# Patient Record
Sex: Male | Born: 1940 | Race: White | Hispanic: No | Marital: Married | Smoking: Former smoker
Health system: Southern US, Community
[De-identification: ages and names within clinical notes are randomized; demographics above are authoritative.]

## PROBLEM LIST (undated history)

## (undated) DIAGNOSIS — C4490 Unspecified malignant neoplasm of skin, unspecified: Secondary | ICD-10-CM

## (undated) DIAGNOSIS — E785 Hyperlipidemia, unspecified: Secondary | ICD-10-CM

## (undated) DIAGNOSIS — Z9889 Other specified postprocedural states: Secondary | ICD-10-CM

## (undated) DIAGNOSIS — R112 Nausea with vomiting, unspecified: Secondary | ICD-10-CM

## (undated) DIAGNOSIS — D369 Benign neoplasm, unspecified site: Secondary | ICD-10-CM

## (undated) DIAGNOSIS — I10 Essential (primary) hypertension: Secondary | ICD-10-CM

## (undated) DIAGNOSIS — M810 Age-related osteoporosis without current pathological fracture: Secondary | ICD-10-CM

## (undated) DIAGNOSIS — Z87442 Personal history of urinary calculi: Secondary | ICD-10-CM

## (undated) DIAGNOSIS — M5030 Other cervical disc degeneration, unspecified cervical region: Secondary | ICD-10-CM

## (undated) DIAGNOSIS — M869 Osteomyelitis, unspecified: Secondary | ICD-10-CM

## (undated) HISTORY — PX: TONSILLECTOMY: SUR1361

## (undated) HISTORY — PX: JOINT REPLACEMENT: SHX530

## (undated) HISTORY — PX: CHOLECYSTECTOMY: SHX55

## (undated) HISTORY — PX: BACK SURGERY: SHX140

## (undated) HISTORY — PX: CATARACT EXTRACTION W/ INTRAOCULAR LENS  IMPLANT, BILATERAL: SHX1307

## (undated) HISTORY — PX: COLONOSCOPY: SHX174

## (undated) HISTORY — PX: NECK SURGERY: SHX720

## (undated) HISTORY — PX: WRIST SURGERY: SHX841

## (undated) HISTORY — PX: SHOULDER SURGERY: SHX246

## (undated) HISTORY — PX: REPLACEMENT TOTAL KNEE: SUR1224

---

## 1998-11-18 ENCOUNTER — Encounter: Payer: Self-pay | Admitting: Orthopedic Surgery

## 1998-11-24 ENCOUNTER — Encounter: Payer: Self-pay | Admitting: Orthopedic Surgery

## 1998-11-24 ENCOUNTER — Inpatient Hospital Stay (HOSPITAL_COMMUNITY): Admission: RE | Admit: 1998-11-24 | Discharge: 1998-11-28 | Payer: Self-pay | Admitting: Orthopedic Surgery

## 2004-10-27 ENCOUNTER — Ambulatory Visit: Payer: Self-pay | Admitting: Ophthalmology

## 2005-08-07 ENCOUNTER — Emergency Department: Payer: Self-pay | Admitting: Emergency Medicine

## 2005-09-02 ENCOUNTER — Emergency Department: Payer: Self-pay | Admitting: Emergency Medicine

## 2005-09-03 ENCOUNTER — Ambulatory Visit: Payer: Self-pay | Admitting: Urology

## 2005-09-03 ENCOUNTER — Other Ambulatory Visit: Payer: Self-pay

## 2008-06-12 ENCOUNTER — Ambulatory Visit: Payer: Self-pay | Admitting: Internal Medicine

## 2008-10-26 ENCOUNTER — Ambulatory Visit: Payer: Self-pay | Admitting: Internal Medicine

## 2009-01-21 ENCOUNTER — Ambulatory Visit: Payer: Self-pay | Admitting: Unknown Physician Specialty

## 2009-04-18 ENCOUNTER — Ambulatory Visit: Payer: Self-pay | Admitting: Internal Medicine

## 2009-06-19 ENCOUNTER — Encounter: Admission: RE | Admit: 2009-06-19 | Discharge: 2009-06-19 | Payer: Self-pay | Admitting: Neurological Surgery

## 2009-06-30 ENCOUNTER — Inpatient Hospital Stay (HOSPITAL_COMMUNITY): Admission: RE | Admit: 2009-06-30 | Discharge: 2009-07-02 | Payer: Self-pay | Admitting: Neurological Surgery

## 2011-02-27 ENCOUNTER — Emergency Department: Payer: Self-pay | Admitting: Emergency Medicine

## 2011-03-05 ENCOUNTER — Emergency Department: Payer: Self-pay | Admitting: Emergency Medicine

## 2011-03-06 LAB — BASIC METABOLIC PANEL
Chloride: 105 mEq/L (ref 96–112)
GFR calc Af Amer: >60 mL/min (ref >60)
GFR calc non Af Amer: >60 mL/min (ref >60)
Potassium: 3.9 mEq/L (ref 3.5–5.1)

## 2011-03-06 LAB — CBC
HCT: 36.1 % — ABNORMAL LOW (ref 39.0–52.0)
MCV: 89.4 fL (ref 78.0–100.0)
RBC: 4.04 MIL/uL — ABNORMAL LOW (ref 4.22–5.81)
WBC: 8 10*3/uL (ref 4.0–10.5)

## 2011-03-06 LAB — GLUCOSE, CAPILLARY
Glucose-Capillary: 106 mg/dL — ABNORMAL HIGH (ref 70–99)
Glucose-Capillary: 114 mg/dL — ABNORMAL HIGH (ref 70–99)
Glucose-Capillary: 163 mg/dL — ABNORMAL HIGH (ref 70–99)
Glucose-Capillary: 167 mg/dL — ABNORMAL HIGH (ref 70–99)
Glucose-Capillary: 168 mg/dL — ABNORMAL HIGH (ref 70–99)

## 2011-03-06 LAB — ABO/RH: ABO/RH(D): A POS

## 2011-03-22 ENCOUNTER — Ambulatory Visit: Payer: Self-pay | Admitting: Surgery

## 2011-04-13 NOTE — Op Note (Signed)
Jay Brown, KRESSE            ACCOUNT NO.:  [id number]   MEDICAL RECORD NO.:  [id number]          PATIENT TYPE:  INP   LOCATION:  3172                         FACILITY:  [facility]   PHYSICIAN:  Stefani Dama, M.D.  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   PROCEDURE:  Bilateral laminotomies L4-L5 with decompression of L4 and L5  nerve roots with a total diskectomy L4-L5, posterior lumbar interbody  arthrodesis with placement of PEEK spacers, allograft and infuse  nonsegmental fixation L4-L5 with pedicle screws, posterolateral  arthrodesis L4-L5 with allograft.   SURGEON:  Dr. Barnett Abu.   FIRST ASSISTANT:  Hardin Negus, PA.   INDICATIONS:  Bernabe Dorce is a 70 year old individual who has had  significant back and bilateral lower extremity pain and weakness.  He  has evidence of a herniated nucleus pulposus with a fragment of disk on  the left side at L4-L5.  He has advanced spondylitic changes with severe  stenosis at L4-L5 also.  He has been advised regarding surgical  decompression and arthrodesis.   PROCEDURE:  The patient was brought to the operating room supine on a  stretcher.  After smooth induction of general endotracheal anesthesia,  he was turned prone.  The back was shaved and prepped with alcohol and  DuraPrep and draped in a sterile fashion.  Midline incision was created  and carried down to the lumbodorsal fascia, which was opened on either  side of midline to expose the lumbar vertebrae of L4 and L5.  L4 was  marked and identified positively with a radiograph.  Interlaminar space  at L4-L5 was then  cleared, and the inferior marginal lamina out to the  medial wall of the facet was then taken up with a high-speed bur.  A 2  and 3-mm Kerrison punch was then used to remove overgrown bone and  ligament from within the canal, and the common dural tube was then  decompressed.  The dissection was carried out to the lateral aspect of  the dura and superiorly a laminotomy was created to identify the L4  nerve root.  Superomedially on the left side, there was identified a  large calcified fragment of disk that was causing some compression on  the L4 nerve root and the common dural tube with vigorous dissection  using some recurved osteotomes and curettes.  The fragment could be  mobilized, and it could be removed in several pieces.  This provided  good decompression of the L4 nerve root and the common dural tube.  Diskectomy was then performed in the disk space, and here there was  noted be a substantial quantity of severely degenerating, desiccated  disk material.  The disk space was evacuated, and care was taken to  protect the L5 nerve root inferiorly and also to provide decompression  of this nerve root.  This was accomplished first on the left side and  then on the right side.  The interlaminar space and the interlaminar  ligament was then taken up.  The interspace was decorticated  appropriately, and it was then sized for appropriate size spacer.  It  was felt that 10-mm spacers would fit well into the interspace.  The  spacers were filled with a mixture of Infuse and Vitoss bone material,  and this was placed into the interspace.  The interspace itself was  filled with the same combination of materials, and the other PEEK spacer  was placed on the opposite side.  Pedicle screw entry sites were then  chosen at L4-L5 with radiographic control.  A 5.5 tap was used in L4 and  6.5 x 45 mm screws were placed into the L4 vertebra with each hole being  sounded after tapping before placing the  screws.   Radiographic confirmation of the screws was obtained, and when this  positioning was good, 35-mm pre-contoured rods were used to connect the  pedicle screws at L4-L5.  The lateral gutters had been previously  prepared and/or decorticated, and these were then packed with same  combination of Infuse and Vitoss bone sponge.  Hemostasis in the soft  tissues was then obtained meticulously.  The lumbodorsal fascia was  closed with #1 Vicryl in interrupted fashion, 2-0 Vicryl used in the  subcutaneous tissues, 3-0 Vicryl subcuticularly, and Dermabond on the  skin.  The patient tolerated the procedure well, was returned to the  recovery room in stable condition.      Stefani Dama, M.D.  Electronically Signed     HJE/MEDQ  D:  06/30/2009  T:  06/30/2009  Job:  [id number]

## 2011-12-01 ENCOUNTER — Other Ambulatory Visit: Payer: Self-pay | Admitting: Unknown Physician Specialty

## 2011-12-01 LAB — CLOSTRIDIUM DIFFICILE BY PCR

## 2011-12-03 LAB — STOOL CULTURE

## 2011-12-13 ENCOUNTER — Other Ambulatory Visit: Payer: Self-pay | Admitting: Unknown Physician Specialty

## 2011-12-23 ENCOUNTER — Ambulatory Visit: Payer: Self-pay | Admitting: Unknown Physician Specialty

## 2012-01-28 ENCOUNTER — Ambulatory Visit: Payer: Self-pay | Admitting: Internal Medicine

## 2012-02-21 ENCOUNTER — Ambulatory Visit: Payer: Self-pay | Admitting: Internal Medicine

## 2012-02-24 LAB — CBC CANCER CENTER
Basophil %: 0.8 %
Basophil: 2 %
Eosinophil #: 0.2 x10 3/mm (ref 0.0–0.7)
Eosinophil %: 2.5 %
HCT: 37.2 % — ABNORMAL LOW (ref 40.0–52.0)
Lymphocyte %: 27.8 %
Lymphocytes: 35 %
MCH: 28.6 pg (ref 26.0–34.0)
MCV: 87.6 fL (ref 80–100)
Monocyte #: 0.8 x10 3/mm — ABNORMAL HIGH (ref 0.0–0.7)
Monocytes: 10 %
RBC: 4.24 10*6/uL — ABNORMAL LOW (ref 4.40–5.90)
RDW: 12.9 % (ref 11.5–14.5)
WBC: 8 x10 3/mm (ref 3.8–10.6)

## 2012-02-24 LAB — FERRITIN: Ferritin (ARMC): 30 ng/mL (ref 8–388)

## 2012-02-24 LAB — RETICULOCYTES: Absolute Retic Count: 0.146 10*6/uL — ABNORMAL HIGH (ref 0.024–0.084)

## 2012-02-24 LAB — IRON AND TIBC: Iron: 53 ug/dL — ABNORMAL LOW (ref 65–175)

## 2012-02-24 LAB — FOLATE: Folic Acid: 38.5 ng/mL (ref 3.1–100.0)

## 2012-02-28 ENCOUNTER — Ambulatory Visit: Payer: Self-pay | Admitting: Internal Medicine

## 2012-02-28 LAB — PROT IMMUNOELECTROPHORES(ARMC)

## 2012-03-29 ENCOUNTER — Ambulatory Visit: Payer: Self-pay | Admitting: Internal Medicine

## 2014-01-08 ENCOUNTER — Other Ambulatory Visit: Payer: Self-pay | Admitting: Podiatry

## 2014-01-13 LAB — WOUND CULTURE

## 2014-02-19 ENCOUNTER — Ambulatory Visit: Payer: Self-pay | Admitting: Podiatry

## 2014-02-19 LAB — CBC WITH DIFFERENTIAL/PLATELET
BASOS ABS: 0.1 10*3/uL (ref 0.0–0.1)
BASOS PCT: 1.1 %
EOS ABS: 0.1 10*3/uL (ref 0.0–0.7)
EOS PCT: 0.9 %
HCT: 40.7 % (ref 40.0–52.0)
HGB: 13.2 g/dL (ref 13.0–18.0)
LYMPHS ABS: 2.8 10*3/uL (ref 1.0–3.6)
LYMPHS PCT: 23.1 %
MCH: 28 pg (ref 26.0–34.0)
MCHC: 32.4 g/dL (ref 32.0–36.0)
MCV: 87 fL (ref 80–100)
MONO ABS: 1.1 x10 3/mm — AB (ref 0.2–1.0)
Monocyte %: 9 %
NEUTROS PCT: 65.9 %
Neutrophil #: 7.9 10*3/uL — ABNORMAL HIGH (ref 1.4–6.5)
PLATELETS: 291 10*3/uL (ref 150–440)
RBC: 4.7 10*6/uL (ref 4.40–5.90)
RDW: 13.6 % (ref 11.5–14.5)
WBC: 12 10*3/uL — AB (ref 3.8–10.6)

## 2014-02-19 LAB — BASIC METABOLIC PANEL
ANION GAP: 3 — AB (ref 7–16)
BUN: 16 mg/dL (ref 7–18)
CALCIUM: 8.9 mg/dL (ref 8.5–10.1)
CO2: 28 mmol/L (ref 21–32)
Chloride: 107 mmol/L (ref 98–107)
Creatinine: 1.21 mg/dL (ref 0.60–1.30)
EGFR (Non-African Amer.): 59 — ABNORMAL LOW
Glucose: 161 mg/dL — ABNORMAL HIGH (ref 65–99)
OSMOLALITY: 280 (ref 275–301)
Potassium: 4 mmol/L (ref 3.5–5.1)
Sodium: 138 mmol/L (ref 136–145)

## 2014-02-22 ENCOUNTER — Ambulatory Visit: Payer: Self-pay | Admitting: Podiatry

## 2014-02-26 LAB — PATHOLOGY REPORT

## 2014-02-26 LAB — WOUND CULTURE

## 2015-03-22 NOTE — Op Note (Signed)
PATIENT NAME:  Jay Brown, Jay Brown MR#:  [id number] DATE OF BIRTH:  1941/11/26  DATE OF PROCEDURE:  02/22/2014  PREOPERATIVE DIAGNOSIS: Osteomyelitis, right third toe.   POSTOPERATIVE DIAGNOSIS: Osteomyelitis, right third toe.   PROCEDURE: Partial amputation, right third toe.   SURGEON: Durward Fortes, DPM   ANESTHESIA: Local MAC.   HEMOSTASIS: Pneumatic tourniquet, right ankle, 250 mmHg.   ESTIMATED BLOOD LOSS: Minimal.   MATERIALS: None.   COMPLICATIONS: None apparent.   PATHOLOGY: Distal right third toe.   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 The patient elects for amputation of the distal aspect of the third toe.   OPERATIVE PROCEDURE: The patient was taken to the operating room and placed on the table in the supine position. Following satisfactory sedation, the right foot was anesthetized with 10 mL of 0.5% Sensorcaine plain around the third metatarsal. A pneumatic tourniquet was applied at the level of the right ankle and the foot was prepped and draped in the usual sterile fashion.   Attention was then directed to the distal aspect of the right third toe, where a fishmouth-type incision was made coursing medial to lateral, encompassing the third toe. Dissection was carried proximally back to the level of the proximal interphalangeal joint, where the toe was disarticulated and removed in toto. Good healthy tissues were noted. The wound was flushed with copious amounts of sterile saline and closed using 5-0 nylon simple interrupted sutures for skin closure. Xeroform, 4 x 4's, and a gauze were then applied followed by Kerlix and an Ace wrap. The tourniquet was released. The patient tolerated the procedure and anesthesia well and was transported to the PACU with vital signs stable and in good condition.    ____________________________ Sharlotte Alamo,  DPM tc:jcm D: 02/22/2014 13:34:12 ET T: 02/22/2014 14:16:48 ET JOB#: [id number]  cc: Sharlotte Alamo, DPM, <Dictator> Jay Brown DPM ELECTRONICALLY SIGNED 03/19/2014 11:33

## 2015-06-23 ENCOUNTER — Telehealth: Payer: Self-pay | Admitting: Family Medicine

## 2015-07-09 ENCOUNTER — Ambulatory Visit (INDEPENDENT_AMBULATORY_CARE_PROVIDER_SITE_OTHER): Payer: Medicare PPO | Admitting: Family Medicine

## 2015-07-09 ENCOUNTER — Encounter: Payer: Self-pay | Admitting: Family Medicine

## 2015-07-09 VITALS — BP 120/60 | HR 68 | Temp 98.0°F | Resp 16 | Ht 75.0 in | Wt 236.0 lb

## 2015-07-09 DIAGNOSIS — K589 Irritable bowel syndrome without diarrhea: Secondary | ICD-10-CM | POA: Diagnosis not present

## 2015-07-09 DIAGNOSIS — E119 Type 2 diabetes mellitus without complications: Secondary | ICD-10-CM | POA: Diagnosis not present

## 2015-07-09 DIAGNOSIS — I10 Essential (primary) hypertension: Secondary | ICD-10-CM | POA: Diagnosis not present

## 2015-07-09 DIAGNOSIS — M1990 Unspecified osteoarthritis, unspecified site: Secondary | ICD-10-CM

## 2015-07-09 LAB — POCT GLYCOSYLATED HEMOGLOBIN (HGB A1C): Hemoglobin A1C: 8.2

## 2015-07-09 MED ORDER — INSULIN GLARGINE 100 UNIT/ML SOLOSTAR PEN: 45.0000 [IU] | PEN_INJECTOR | Freq: Every day | SUBCUTANEOUS | Status: DC

## 2015-07-09 NOTE — Patient Instructions (Signed)
Stop Meloxicam.  Use Metamucil daily for IBS.  Decrease calories and lose weight.  Check BSs at least once a day.

## 2015-07-09 NOTE — Progress Notes (Signed)
Name: Jay Brown   MRN: [id number]    DOB: [birth year]   Date:07/09/2015       Progress Note  Subjective  Chief Complaint  Chief Complaint  Patient presents with  . Follow-up    diabetes    HPI  For f/u of DMN.  Also has HCP and IBS and arthritis.  Does not check BSs at home.  Eating ice cream daily this summer.  C/o IBS sx with diarrhea every AM.    No problem-specific assessment & plan notes found for this encounter.   Past Medical History  Diagnosis Date  . Diabetes     Social History  Substance Use Topics  . Smoking status: Former Smoker -- 0.50 packs/day for 10 years    Types: Cigarettes  . Smokeless tobacco: Never Used  . Alcohol Use: No     Current outpatient prescriptions:  .  ferrous sulfate 325 (65 FE) MG EC tablet, Take 325 mg by mouth daily., Disp: , Rfl:  .  LANTUS SOLOSTAR 100 UNIT/ML Solostar Pen, Inject 40 Units into the skin daily., Disp: , Rfl:  .  lisinopril (PRINIVIL,ZESTRIL) 10 MG tablet, Take 10 mg by mouth daily., Disp: , Rfl:  .  meloxicam (MOBIC) 15 MG tablet, Take 15 mg by mouth daily., Disp: , Rfl:  .  VICTOZA 18 MG/3ML SOPN, Inject 18 mg into the skin daily., Disp: , Rfl:   Not on File  Review of Systems  Constitutional: Negative for fever, chills, weight loss and malaise/fatigue.  HENT: Negative for hearing loss.   Eyes: Negative for blurred vision and double vision.  Respiratory: Negative for cough, sputum production, shortness of breath and wheezing.   Cardiovascular: Negative for chest pain, palpitations, orthopnea and leg swelling.  Gastrointestinal: Positive for diarrhea (related to IBS). Negative for heartburn, nausea, vomiting, abdominal pain and blood in stool.  Genitourinary: Negative for dysuria, urgency and frequency.  Musculoskeletal: Negative for myalgias and joint pain.  Skin: Negative for rash.  Neurological: Negative for dizziness, tingling, sensory change, focal weakness, weakness and headaches.   Psychiatric/Behavioral: Negative for depression. The patient is not nervous/anxious.       Objective  Filed Vitals:   07/09/15 0812  BP: 154/74  Pulse: 68  Temp: 98 F (36.7 C)  TempSrc: Oral  Resp: 16  Height: 6\' 3"  (1.905 m)  Weight: 236 lb (107.049 kg)     Physical Exam  Constitutional: He is oriented to person, place, and time and well-developed, well-nourished, and in no distress. No distress.  HENT:  Head: Normocephalic and atraumatic.  Eyes: Conjunctivae and EOM are normal. Pupils are equal, round, and reactive to light. No scleral icterus.  Neck: Normal range of motion. Neck supple. Carotid bruit is not present. No thyromegaly present.  Cardiovascular: Normal rate, regular rhythm, normal heart sounds and intact distal pulses.  Exam reveals no gallop and no friction rub.   No murmur heard. Pulmonary/Chest: Effort normal and breath sounds normal. No respiratory distress. He has no wheezes. He has no rales.  Abdominal: Soft. Bowel sounds are normal. He exhibits no distension and no mass. There is no tenderness.  Musculoskeletal: Normal range of motion. He exhibits no edema.  Lymphadenopathy:    He has no cervical adenopathy.  Neurological: He is alert and oriented to person, place, and time.  Skin: Skin is warm and dry.  Vitals reviewed.     Recent Results (from the past 2160 hour(s))  POCT HgB A1C     Status: Abnormal  Collection Time: 07/09/15  8:33 AM  Result Value Ref Range   Hemoglobin A1C 8.2      Assessment & Plan  1. Type 2 diabetes mellitus without complication  - POCT HgB A1C -8.2 - Insulin Glargine (LANTUS SOLOSTAR) 100 UNIT/ML Solostar Pen; Inject 45 Units into the skin daily at 10 pm.  Dispense: 15 pen; Refill: 3 - cont. Victoza. 2. Essential hypertension -Cont. Lisinopril  3. IBS (irritable bowel syndrome)  - Use Metamucil. Daily.  4. Arthritis -stop Meloxicam

## 2015-07-14 ENCOUNTER — Other Ambulatory Visit: Payer: Self-pay | Admitting: *Deleted

## 2015-07-14 ENCOUNTER — Telehealth: Payer: Self-pay | Admitting: Family Medicine

## 2015-07-14 DIAGNOSIS — E119 Type 2 diabetes mellitus without complications: Secondary | ICD-10-CM

## 2015-07-14 MED ORDER — ONETOUCH ULTRA SYSTEM W/DEVICE KIT: 1.0000 | PACK | Freq: Once | Status: DC

## 2015-07-14 MED ORDER — GLUCOSE BLOOD VI STRP: ORAL_STRIP | Status: DC

## 2015-07-14 NOTE — Telephone Encounter (Signed)
Dr. Luan Pulling wants pt to check his blood sugar again.  He has a one touch meter that he was using in 2014 but he doesn't have the instruction and needs the strips to use in it.  The call back number is [phone]

## 2015-07-14 NOTE — Telephone Encounter (Signed)
Sent new Kit.[facility]

## 2015-07-15 ENCOUNTER — Other Ambulatory Visit: Payer: Self-pay | Admitting: *Deleted

## 2015-07-15 DIAGNOSIS — E119 Type 2 diabetes mellitus without complications: Secondary | ICD-10-CM

## 2015-07-15 MED ORDER — ONETOUCH ULTRASOFT LANCETS MISC: Status: DC

## 2015-07-15 MED ORDER — GLUCOSE BLOOD VI STRP: ORAL_STRIP | Status: DC

## 2015-08-22 ENCOUNTER — Telehealth: Payer: Self-pay

## 2015-08-22 NOTE — Telephone Encounter (Signed)
Rash and itching in groin area is usually jock itch, a fungus.  Not caused by Victoza.  Have him try Lamisil cream to rash once daily.  May have to use it 2-3 weeks for total resolution.  If he thinks it is a medication related rash, I would have to see him next week.-jh

## 2015-08-22 NOTE — Telephone Encounter (Signed)
Victoza causing rash and itching in Groin area. Patient would like something that works as well. OK to leave detailed message and uswes [facility] [place]

## 2015-08-22 NOTE — Telephone Encounter (Signed)
Will do Lamisil and come back Monday for appt 8:45

## 2015-08-25 ENCOUNTER — Encounter: Payer: Self-pay | Admitting: Family Medicine

## 2015-08-25 ENCOUNTER — Ambulatory Visit (INDEPENDENT_AMBULATORY_CARE_PROVIDER_SITE_OTHER): Payer: Medicare PPO | Admitting: Family Medicine

## 2015-08-25 VITALS — BP 135/70 | HR 61 | Temp 97.8°F | Resp 16 | Ht 75.0 in | Wt 237.0 lb

## 2015-08-25 DIAGNOSIS — E1169 Type 2 diabetes mellitus with other specified complication: Secondary | ICD-10-CM | POA: Insufficient documentation

## 2015-08-25 DIAGNOSIS — E119 Type 2 diabetes mellitus without complications: Secondary | ICD-10-CM

## 2015-08-25 DIAGNOSIS — I10 Essential (primary) hypertension: Secondary | ICD-10-CM

## 2015-08-25 NOTE — Progress Notes (Signed)
Name: Jay Brown   MRN: [id number]    DOB: [birth year]   Date:08/25/2015       Progress Note  Subjective  Chief Complaint  Chief Complaint  Patient presents with  . Rash    itching x 3 months. STOPPED Victoza  BS-150s    HPI Here for f/u of DM and HBP.  He has stopped Victoza for about 1 month.  He felt it was causing a "itchy rash" in groin and on lower legs.  He has no rash now.  Has seen a dermatologist.  BSs increasing at home to 150 range since stopping Victoza.  No problem-specific assessment & plan notes found for this encounter.   Past Medical History  Diagnosis Date  . Diabetes     Social History  Substance Use Topics  . Smoking status: Former Smoker -- 0.50 packs/day for 10 years    Types: Cigarettes  . Smokeless tobacco: Never Used  . Alcohol Use: No     Current outpatient prescriptions:  .  Blood Glucose Monitoring Suppl (ONE TOUCH ULTRA SYSTEM KIT) W/DEVICE KIT, 1 kit by Does not apply route once., Disp: 1 each, Rfl: 0 .  ferrous sulfate 325 (65 FE) MG EC tablet, Take 325 mg by mouth daily., Disp: , Rfl:  .  glucose blood test strip, Apply 1 strip to machine to check blood sugar once daily. Dx.E11.9, Disp: 100 each, Rfl: 12 .  Insulin Glargine (LANTUS SOLOSTAR) 100 UNIT/ML Solostar Pen, Inject 45 Units into the skin daily at 10 pm., Disp: 15 pen, Rfl: 3 .  Lancets (ONETOUCH ULTRASOFT) lancets, Insert lancet to prick finger once daily. Dx. E11.9, Disp: 100 each, Rfl: 12 .  lisinopril (PRINIVIL,ZESTRIL) 40 MG tablet, , Disp: , Rfl:  .  VICTOZA 18 MG/3ML SOPN, Inject 18 mg into the skin daily., Disp: , Rfl:   No Known Allergies  Review of Systems  Constitutional: Negative for fever, chills, weight loss and malaise/fatigue.  HENT: Negative for hearing loss.   Eyes: Negative for blurred vision and double vision.  Respiratory: Negative for cough, sputum production, shortness of breath and wheezing.   Cardiovascular: Negative for chest pain, palpitations,  orthopnea and leg swelling.  Gastrointestinal: Negative for heartburn, abdominal pain and blood in stool.  Genitourinary: Negative for dysuria, urgency and frequency.  Musculoskeletal: Negative for myalgias and joint pain.  Skin: Positive for itching (resolved) and rash (resolved).  Neurological: Negative for dizziness, weakness and headaches.  Psychiatric/Behavioral: Negative for depression. The patient is not nervous/anxious.       Objective  Filed Vitals:   08/25/15 0859  BP: 135/70  Pulse: 61  Temp: 97.8 F (36.6 C)  Resp: 16  Height: _0  (1.905 m)  Weight: 237 lb (107.502 kg)     Physical Exam  Constitutional: He is oriented to person, place, and time and well-developed, well-nourished, and in no distress. No distress.  HENT:  Head: Normocephalic and atraumatic.  Eyes: Conjunctivae and EOM are normal. Pupils are equal, round, and reactive to light. No scleral icterus.  Neck: Normal range of motion. Neck supple. Carotid bruit is not present. No thyromegaly present.  Cardiovascular: Normal rate, regular rhythm, normal heart sounds and intact distal pulses.  Exam reveals no gallop and no friction rub.   No murmur heard. Pulmonary/Chest: Effort normal and breath sounds normal. No respiratory distress. He has no wheezes. He has no rales.  Abdominal: Soft. Bowel sounds are normal. He exhibits no distension, no abdominal bruit and no mass.  There is no tenderness.  Musculoskeletal: He exhibits no edema.  Lymphadenopathy:    He has no cervical adenopathy.  Neurological: He is alert and oriented to person, place, and time.  Vitals reviewed.     Recent Results (from the past 2160 hour(s))  POCT HgB A1C     Status: Abnormal   Collection Time: 07/09/15  8:33 AM  Result Value Ref Range   Hemoglobin A1C 8.2      Assessment & Plan  1. Type 2 diabetes mellitus without complication -Cont. Lantus and restart Victoza. - Comprehensive Metabolic Panel (CMET) - CBC with  Differential - Lipid Profile  2. Essential hypertension -Cont. Lisinopril

## 2015-08-25 NOTE — Patient Instructions (Addendum)
Got flu shot at [place], [place], 2 weeks ago (Sept. 12, 2016)

## 2015-09-10 NOTE — Telephone Encounter (Signed)
Error

## 2015-09-22 LAB — CBC WITH DIFFERENTIAL/PLATELET
BASOS: 0 %
Basophils Absolute: 0 10*3/uL (ref 0.0–0.2)
EOS (ABSOLUTE): 0.1 10*3/uL (ref 0.0–0.4)
EOS: 1 %
HEMATOCRIT: 38.7 % (ref 37.5–51.0)
Hemoglobin: 13 g/dL (ref 12.6–17.7)
IMMATURE GRANS (ABS): 0 10*3/uL (ref 0.0–0.1)
IMMATURE GRANULOCYTES: 0 %
LYMPHS: 16 %
Lymphocytes Absolute: 1.6 10*3/uL (ref 0.7–3.1)
MCH: 29 pg (ref 26.6–33.0)
MCHC: 33.6 g/dL (ref 31.5–35.7)
MCV: 86 fL (ref 79–97)
MONOCYTES: 10 %
Monocytes Absolute: 1 10*3/uL — ABNORMAL HIGH (ref 0.1–0.9)
NEUTROS PCT: 73 %
Neutrophils Absolute: 7.5 10*3/uL — ABNORMAL HIGH (ref 1.4–7.0)
Platelets: 311 10*3/uL (ref 150–379)
RBC: 4.48 x10E6/uL (ref 4.14–5.80)
RDW: 13.3 % (ref 12.3–15.4)
WBC: 10.2 10*3/uL (ref 3.4–10.8)

## 2015-09-23 LAB — COMPREHENSIVE METABOLIC PANEL
ALT: 14 IU/L (ref 0–44)
AST: 16 IU/L (ref 0–40)
Albumin/Globulin Ratio: 1.1 (ref 1.1–2.5)
Albumin: 3.5 g/dL (ref 3.5–4.8)
Alkaline Phosphatase: 82 IU/L (ref 39–117)
BILIRUBIN TOTAL: 0.4 mg/dL (ref 0.0–1.2)
BUN/Creatinine Ratio: 12 (ref 10–22)
BUN: 13 mg/dL (ref 8–27)
CHLORIDE: 103 mmol/L (ref 97–106)
CO2: 21 mmol/L (ref 18–29)
Calcium: 9 mg/dL (ref 8.6–10.2)
Creatinine, Ser: 1.05 mg/dL (ref 0.76–1.27)
GFR calc non Af Amer: 70 mL/min/{1.73_m2} (ref >59)
GFR, EST AFRICAN AMERICAN: 80 mL/min/{1.73_m2} (ref >59)
GLUCOSE: 228 mg/dL — AB (ref 65–99)
Globulin, Total: 3.1 g/dL (ref 1.5–4.5)
POTASSIUM: 3.9 mmol/L (ref 3.5–5.2)
Sodium: 140 mmol/L (ref 136–144)
Total Protein: 6.6 g/dL (ref 6.0–8.5)

## 2015-09-23 LAB — LIPID PANEL
CHOL/HDL RATIO: 5.5 ratio — AB (ref 0.0–5.0)
Cholesterol, Total: 142 mg/dL (ref 100–199)
HDL: 26 mg/dL — AB (ref >39)
LDL Calculated: 91 mg/dL (ref 0–99)
TRIGLYCERIDES: 123 mg/dL (ref 0–149)

## 2015-09-23 LAB — HEMOGLOBIN A1C: HEMOGLOBIN A1C: 8.3 % — AB (ref 4.8–5.6)

## 2015-10-09 ENCOUNTER — Ambulatory Visit: Payer: Medicare PPO | Admitting: Family Medicine

## 2015-10-15 ENCOUNTER — Ambulatory Visit (INDEPENDENT_AMBULATORY_CARE_PROVIDER_SITE_OTHER): Payer: Medicare PPO | Admitting: Family Medicine

## 2015-10-15 ENCOUNTER — Encounter: Payer: Self-pay | Admitting: Family Medicine

## 2015-10-15 VITALS — BP 128/75 | HR 68 | Resp 16 | Wt 235.0 lb

## 2015-10-15 DIAGNOSIS — Z794 Long term (current) use of insulin: Secondary | ICD-10-CM

## 2015-10-15 DIAGNOSIS — E119 Type 2 diabetes mellitus without complications: Secondary | ICD-10-CM

## 2015-10-15 DIAGNOSIS — K529 Noninfective gastroenteritis and colitis, unspecified: Secondary | ICD-10-CM

## 2015-10-15 NOTE — Progress Notes (Signed)
Name: Jay Brown   MRN: [id number]    DOB: 1941-09-06   Date:10/15/2015       Progress Note  Subjective  Chief Complaint  Chief Complaint  Patient presents with  . Diabetes    A1c 09/22/15-8.3  . Diarrhea    ongoing x 2 yrs    HPI Here for f/u of Dm.  Reports fasting BSs of 110-130 fasting.  Does not check BSs any other time of day.  C/o diarrhea daily for 2 years.  Had seen GI 2 yrs ago.  No dx and no relief.  Has 6-10 times a day.  Can awaken from sleep.  No blood.  Most stooling in AM.  Better later in day.  No problem-specific assessment & plan notes found for this encounter.   Past Medical History  Diagnosis Date  . Diabetes [facility])     Social History  Substance Use Topics  . Smoking status: Former Smoker -- 0.50 packs/day for 10 years    Types: Cigarettes  . Smokeless tobacco: Never Used  . Alcohol Use: No     Current outpatient prescriptions:  .  Blood Glucose Monitoring Suppl (ONE TOUCH ULTRA SYSTEM KIT) W/DEVICE KIT, 1 kit by Does not apply route once., Disp: 1 each, Rfl: 0 .  ferrous sulfate 325 (65 FE) MG EC tablet, Take 325 mg by mouth daily., Disp: , Rfl:  .  glucose blood test strip, Apply 1 strip to machine to check blood sugar once daily. Dx.E11.9, Disp: 100 each, Rfl: 12 .  Insulin Glargine (LANTUS SOLOSTAR) 100 UNIT/ML Solostar Pen, Inject 45 Units into the skin daily at 10 pm., Disp: 15 pen, Rfl: 3 .  Lancets (ONETOUCH ULTRASOFT) lancets, Insert lancet to prick finger once daily. Dx. E11.9, Disp: 100 each, Rfl: 12 .  lisinopril (PRINIVIL,ZESTRIL) 40 MG tablet, , Disp: , Rfl:  .  VICTOZA 18 MG/3ML SOPN, Inject 1.8 mg into the skin daily. , Disp: , Rfl:   No Known Allergies  Review of Systems  Constitutional: Negative for fever, chills, weight loss and malaise/fatigue.  HENT: Negative for hearing loss.   Eyes: Negative for blurred vision and double vision.  Respiratory: Negative for cough, shortness of breath and wheezing.   Cardiovascular:  Negative for chest pain, palpitations and leg swelling.  Gastrointestinal: Positive for heartburn and diarrhea. Negative for nausea, vomiting, abdominal pain, blood in stool and melena.  Genitourinary: Negative for dysuria, urgency and frequency.  Musculoskeletal: Negative for myalgias and joint pain.  Neurological: Negative for dizziness, tremors, weakness and headaches.      Objective  Filed Vitals:   10/15/15 0810  BP: 128/75  Pulse: 68  Resp: 16  Weight: 235 lb (106.595 kg)     Physical Exam  Constitutional: He is well-developed, well-nourished, and in no distress. No distress.  HENT:  Head: Normocephalic and atraumatic.  Eyes: Conjunctivae and EOM are normal. Pupils are equal, round, and reactive to light. No scleral icterus.  Neck: Normal range of motion. Neck supple. Carotid bruit is not present. No thyromegaly present.  Cardiovascular: Normal rate, regular rhythm, normal heart sounds and intact distal pulses.  Exam reveals no gallop and no friction rub.   No murmur heard. Pulmonary/Chest: Effort normal and breath sounds normal. No respiratory distress. He has no wheezes. He has no rales.  Abdominal: Soft. Bowel sounds are normal. He exhibits no distension, no abdominal bruit and no mass. There is no tenderness.  Musculoskeletal: He exhibits no edema.  Lymphadenopathy:  He has no cervical adenopathy.  Vitals reviewed.     Recent Results (from the past 2160 hour(s))  Comprehensive Metabolic Panel (CMET)     Status: Abnormal   Collection Time: 09/22/15  8:27 AM  Result Value Ref Range   Glucose 228 (H) 65 - 99 mg/dL   BUN 13 8 - 27 mg/dL   Creatinine, Ser 1.05 0.76 - 1.27 mg/dL   GFR calc non Af Amer 70 >59 mL/min/1.73   GFR calc Af Amer 80 >59 mL/min/1.73   BUN/Creatinine Ratio 12 10 - 22   Sodium 140 136 - 144 mmol/L    Comment:               **Please note reference interval change**   Potassium 3.9 3.5 - 5.2 mmol/L    Comment:               **Please note  reference interval change**   Chloride 103 97 - 106 mmol/L    Comment:               **Please note reference interval change**   CO2 21 18 - 29 mmol/L   Calcium 9.0 8.6 - 10.2 mg/dL   Total Protein 6.6 6.0 - 8.5 g/dL   Albumin 3.5 3.5 - 4.8 g/dL   Globulin, Total 3.1 1.5 - 4.5 g/dL   Albumin/Globulin Ratio 1.1 1.1 - 2.5   Bilirubin Total 0.4 0.0 - 1.2 mg/dL   Alkaline Phosphatase 82 39 - 117 IU/L   AST 16 0 - 40 IU/L   ALT 14 0 - 44 IU/L  CBC with Differential     Status: Abnormal   Collection Time: 09/22/15  8:27 AM  Result Value Ref Range   WBC 10.2 3.4 - 10.8 x10E3/uL   RBC 4.48 4.14 - 5.80 x10E6/uL   Hemoglobin 13.0 12.6 - 17.7 g/dL   Hematocrit 38.7 37.5 - 51.0 %   MCV 86 79 - 97 fL   MCH 29.0 26.6 - 33.0 pg   MCHC 33.6 31.5 - 35.7 g/dL   RDW 13.3 12.3 - 15.4 %   Platelets 311 150 - 379 x10E3/uL   Neutrophils 73 %   Lymphs 16 %   Monocytes 10 %   Eos 1 %   Basos 0 %   Neutrophils Absolute 7.5 (H) 1.4 - 7.0 x10E3/uL   Lymphocytes Absolute 1.6 0.7 - 3.1 x10E3/uL   Monocytes Absolute 1.0 (H) 0.1 - 0.9 x10E3/uL   EOS (ABSOLUTE) 0.1 0.0 - 0.4 x10E3/uL   Basophils Absolute 0.0 0.0 - 0.2 x10E3/uL   Immature Granulocytes 0 %   Immature Grans (Abs) 0.0 0.0 - 0.1 x10E3/uL  Lipid Profile     Status: Abnormal   Collection Time: 09/22/15  8:27 AM  Result Value Ref Range   Cholesterol, Total 142 100 - 199 mg/dL   Triglycerides 123 0 - 149 mg/dL   HDL 26 (L) >39 mg/dL   LDL Calculated 91 0 - 99 mg/dL   Chol/HDL Ratio 5.5 (H) 0.0 - 5.0 ratio units  Hemoglobin A1c     Status: Abnormal   Collection Time: 09/22/15  8:27 AM  Result Value Ref Range   Hgb A1c MFr Bld 8.3 (H) 4.8 - 5.6 %     Assessment & Plan  1. Type 2 diabetes mellitus without complication, with long-term current use of insulin ([place]) _cont. Lantus and Victoza at current doses. Check BSs at more varied times.  2. Chronic diarrhea  - Ambulatory referral  to Gastroenterology

## 2015-10-15 NOTE — Patient Instructions (Signed)
Patient reports that he will call [facility] for appointment in a month or 2.

## 2015-11-13 ENCOUNTER — Encounter: Payer: Self-pay | Admitting: Gastroenterology

## 2015-11-13 ENCOUNTER — Encounter (INDEPENDENT_AMBULATORY_CARE_PROVIDER_SITE_OTHER): Payer: Self-pay

## 2015-11-13 ENCOUNTER — Ambulatory Visit (INDEPENDENT_AMBULATORY_CARE_PROVIDER_SITE_OTHER): Payer: Medicare PPO | Admitting: Gastroenterology

## 2015-11-13 VITALS — BP 155/72 | HR 68 | Temp 98.1°F | Ht 75.0 in | Wt 239.0 lb

## 2015-11-13 DIAGNOSIS — R197 Diarrhea, unspecified: Secondary | ICD-10-CM

## 2015-11-13 NOTE — Progress Notes (Signed)
Gastroenterology Consultation  Referring Provider:     Arlis Porta., MD Primary Care Physician:  Dicky Doe, MD Primary Gastroenterologist:  Dr. Allen Norris     Reason for Consultation:      Diarrhea        HPI:   Jay Brown is a 74 y.o. y/o male referred for consultation & management of  diarrhea by Dr. Dicky Doe, MD.   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 The patient reports that his diarrhea has been present for about 5 years. The patient was seen 3 years ago by Dr. Vira Agar. At that time the patient had an upper endoscopy and colonoscopy. The biopsies of the colon did not show any signs of inflammation or colitis. The patient states that he has had his well water checked and has not seen any food making his diarrhea worse. The patient also reports that he takes Imodium and the Imodium makes him constipated. He has not been tried on anything else. The patient denies any unexplained weight loss but he has been trying to lose weight because of health issues. There is no report of any black stools or bloody stools. He also denies any abdominal pain associated with the diarrhea. He states that when he was seen by Dr. Vira Agar he was told that he may just have to live with the diarrhea.  Past Medical History  Diagnosis Date  . Diabetes [facility])     Past Surgical History  Procedure Laterality Date  . Shoulder surgery      x5 1980s  . Neck surgery      1980s/90s  . 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 Wrist surgery      2005  . Back surgery      2012  . Cholecystectomy      Prior to Admission medications   Medication Sig Start Date End Date Taking? Authorizing Provider  Blood Glucose Monitoring Suppl (ONE TOUCH ULTRA SYSTEM KIT) W/DEVICE KIT 1 kit by Does not apply route once. 07/14/15  Yes Arlis Porta., MD  ferrous sulfate 325 (65 FE) MG EC tablet Take 325 mg by mouth daily.   Yes Historical Provider, MD  glucose blood test strip Apply  1 strip to machine to check blood sugar once daily. Dx.E11.9 07/15/15  Yes Arlis Porta., MD  Insulin Glargine (LANTUS SOLOSTAR) 100 UNIT/ML Solostar Pen Inject 45 Units into the skin daily at 10 pm. 07/09/15  Yes Arlis Porta., MD  Lancets [facility] ULTRASOFT) lancets Insert lancet to prick finger once daily. Dx. E11.9 07/15/15  Yes Arlis Porta., MD  lisinopril (PRINIVIL,ZESTRIL) 40 MG tablet  07/14/15  Yes Historical Provider, MD  VICTOZA 18 MG/3ML SOPN Inject 1.8 mg into the skin daily.  04/09/15  Yes Historical Provider, MD    Family History  Problem Relation Age of Onset  . Alzheimer's disease Mother   . Cancer Father     lung  . Diabetes Sister      Social History  Substance Use Topics  . Smoking status: Former Smoker -- 0.50 packs/day for 10 years    Types: Cigarettes  . Smokeless tobacco: Never Used  . Alcohol Use: No    Allergies as of 11/13/2015  . (No Known Allergies)    Review of Systems:    All systems reviewed and negative except where noted in HPI.   Physical Exam:  BP  155/72 mmHg  Pulse 68  Temp(Src) 98.1 F (36.7 C) (Oral)  Ht 6' 3"  (1.905 m)  Wt 239 lb (108.41 kg)  BMI 29.87 kg/m2 No LMP for male patient. Psych:  Alert and cooperative. Normal mood and affect. General:   Alert,  Well-developed, well-nourished, pleasant and cooperative in NAD Head:  Normocephalic and atraumatic. Eyes:  Sclera clear, no icterus.   Conjunctiva pink. Ears:  Normal auditory acuity. Nose:  No deformity, discharge, or lesions. Mouth:  No deformity or lesions,oropharynx pink & moist. Neck:  Supple; no masses or thyromegaly. Lungs:  Respirations even and unlabored.  Clear throughout to auscultation.   No wheezes, crackles, or rhonchi. No acute distress. Heart:  Regular rate and rhythm; no murmurs, clicks, rubs, or gallops. Abdomen:  Normal bowel sounds.  No bruits.  Soft, non-tender and non-distended without masses, hepatosplenomegaly or hernias noted.  No  guarding or rebound tenderness.  Negative Carnett sign.   Rectal:  Deferred.  Msk:  Symmetrical without gross deformities.  Good, equal movement & strength bilaterally. Pulses:  Normal pulses noted. Extremities:  No clubbing or edema.  No cyanosis. Neurologic:  Alert and oriented x3;  grossly normal neurologically. Skin:  Intact without significant lesions or rashes.  No jaundice. Lymph Nodes:  No significant cervical adenopathy. Psych:  Alert and cooperative. Normal mood and affect.  Imaging Studies: No results found.  Assessment and Plan:   Jay Brown is a 74 y.o. y/o male  Who comes in with diarrhea. The patient states the diarrhea can go away for a month at a time and then it comes back. He states he had a recent episode that was so bad that he felt like he was dehydrated and started having muscle cramps. The patient has been treating it with Imodium but he states that sometimes it make some constipated. The patient has been told to get liquid Imodium which she can titrate better than the pills. But before he does that he will be started on a trial of Viberzi 75 mg once a day. If it helps he may need to increase to twice a day. The patient will contact me if he does not get any relief from this.  The patient has been explained the plan and agrees with it.   Note: This dictation was prepared with Dragon dictation along with smaller phrase technology. Any transcriptional errors that result from this process are unintentional.

## 2015-11-19 DIAGNOSIS — M25519 Pain in unspecified shoulder: Secondary | ICD-10-CM | POA: Insufficient documentation

## 2015-11-20 DIAGNOSIS — R7881 Bacteremia: Secondary | ICD-10-CM | POA: Insufficient documentation

## 2015-11-20 DIAGNOSIS — A4101 Sepsis due to Methicillin susceptible Staphylococcus aureus: Secondary | ICD-10-CM | POA: Insufficient documentation

## 2015-11-25 ENCOUNTER — Ambulatory Visit: Payer: Medicare PPO | Admitting: Gastroenterology

## 2015-11-28 ENCOUNTER — Other Ambulatory Visit: Payer: Self-pay | Admitting: Family Medicine

## 2015-12-02 ENCOUNTER — Ambulatory Visit: Payer: Medicare PPO | Admitting: Family Medicine

## 2015-12-04 ENCOUNTER — Ambulatory Visit: Payer: Medicare PPO | Admitting: Family Medicine

## 2015-12-24 ENCOUNTER — Telehealth: Payer: Self-pay | Admitting: Family Medicine

## 2015-12-24 NOTE — Telephone Encounter (Signed)
Pt was at [facility] infectious disease unit for 10 days in Dec for sepsis.  He was released on Dec 30th and has been on oxacillin via picc line since.  He broke a tooth and the dentist will not fix it until a dr says it's ok.  He doesn't go back to [facility] until Feb 8th and can't wait until then for have tooth repaired.  Please call [phone]

## 2015-12-25 NOTE — Telephone Encounter (Signed)
He would need to Contact [facility] by phone to ask them this question.  I cannot authorize over them.-jh

## 2015-12-25 NOTE — Telephone Encounter (Signed)
Left msg to call back.Jay Brown

## 2015-12-25 NOTE — Telephone Encounter (Signed)
Spouse called back and left message with Thayer Headings that they have contacted [facility]. Tooth issue has been taken care of.

## 2016-01-19 ENCOUNTER — Other Ambulatory Visit: Payer: Self-pay

## 2016-01-19 ENCOUNTER — Encounter: Payer: Self-pay | Admitting: Family Medicine

## 2016-01-19 ENCOUNTER — Ambulatory Visit (INDEPENDENT_AMBULATORY_CARE_PROVIDER_SITE_OTHER): Payer: Medicare Other | Admitting: Family Medicine

## 2016-01-19 VITALS — BP 150/70 | HR 67 | Temp 97.5°F | Resp 16 | Ht 73.0 in | Wt 232.3 lb

## 2016-01-19 DIAGNOSIS — Z794 Long term (current) use of insulin: Secondary | ICD-10-CM | POA: Diagnosis not present

## 2016-01-19 DIAGNOSIS — A4101 Sepsis due to Methicillin susceptible Staphylococcus aureus: Secondary | ICD-10-CM | POA: Diagnosis not present

## 2016-01-19 DIAGNOSIS — I10 Essential (primary) hypertension: Secondary | ICD-10-CM

## 2016-01-19 DIAGNOSIS — E119 Type 2 diabetes mellitus without complications: Secondary | ICD-10-CM

## 2016-01-19 LAB — POCT GLYCOSYLATED HEMOGLOBIN (HGB A1C): HEMOGLOBIN A1C: 6.3

## 2016-01-19 MED ORDER — GLUCOSE BLOOD VI STRP: ORAL_STRIP | Status: DC

## 2016-01-19 MED ORDER — INSULIN GLARGINE 100 UNIT/ML SOLOSTAR PEN: 40.0000 [IU] | PEN_INJECTOR | Freq: Every day | SUBCUTANEOUS | Status: DC

## 2016-01-19 MED ORDER — AMLODIPINE BESYLATE 5 MG PO TABS: 5.0000 mg | ORAL_TABLET | Freq: Every day | ORAL | Status: DC

## 2016-01-19 NOTE — Progress Notes (Signed)
Name: Jay Brown   MRN: [id number]    DOB: 21-Jul-1941   Date:01/19/2016       Progress Note  Subjective  Chief Complaint  Chief Complaint  Patient presents with  . Diabetes    8.3 08/2015  . Hypertension    bp 183/71 high this week at home: Hospital admit [facility] 11/19/2015-01/07/2016   septic.     HPI Here for f/u of DM and HBP.  Hosp at [facility] for sepsis due to skin infection.  Treated from Dec. 21 thru Feb 8.  Released from [facility] Infectious disease then.  Had PIC line in for 41 days.  Finished antibiotics.  BSs running 60s-1120s over past 2 months.  BP running in 150s-180s over past week or so.  No problem-specific assessment & plan notes found for this encounter.   Past Medical History  Diagnosis Date  . Diabetes [facility])     Past Surgical History  Procedure Laterality Date  . Shoulder surgery      x5 1980s  . Neck surgery      1980s/90s  . 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 Wrist surgery      2005  . Back surgery      2012  . Cholecystectomy      Family History  Problem Relation Age of Onset  . Alzheimer's disease Mother   . Cancer Father     lung  . Diabetes Sister     Social History   Social History  . Marital Status: Married    Spouse Name: N/A  . Number of Children: N/A  . Years of Education: N/A   Occupational History  . Not on file.   Social History Main Topics  . Smoking status: Former Smoker -- 0.50 packs/day for 10 years    Types: Cigarettes  . Smokeless tobacco: Never Used  . Alcohol Use: No  . Drug Use: No  . Sexual Activity: Not on file   Other Topics Concern  . Not on file   Social History Narrative     Current outpatient prescriptions:  .  bacitracin ointment, Apply topically., Disp: , Rfl:  .  Blood Glucose Monitoring Suppl (ONE TOUCH ULTRA SYSTEM KIT) W/DEVICE KIT, 1 kit by Does not apply route once., Disp: 1 each, Rfl: 0 .  ferrous sulfate 325 (65 FE) MG EC tablet, Take 325 mg by mouth daily., Disp: , Rfl:  .   glucose blood test strip, Apply 1 strip to machine to check blood sugar twice daily. Dx.E11.9, Disp: 100 each, Rfl: 12 .  Insulin Glargine (LANTUS SOLOSTAR) 100 UNIT/ML Solostar Pen, Inject 40 Units into the skin daily at 10 pm., Disp: 15 pen, Rfl: 3 .  Lancets (ONETOUCH ULTRASOFT) lancets, Insert lancet to prick finger once daily. Dx. E11.9, Disp: 100 each, Rfl: 12 .  lisinopril (PRINIVIL,ZESTRIL) 40 MG tablet, , Disp: , Rfl:  .  naproxen (NAPROSYN) 250 MG tablet, Take 250 mg by mouth., Disp: , Rfl:  .  Oxacillin Sodium 10 g SOLR, , Disp: , Rfl:  .  tiZANidine (ZANAFLEX) 2 MG tablet, Take 2 mg by mouth., Disp: , Rfl:  .  VICTOZA 18 MG/3ML SOPN, INJECT 1.8MG SUBCUTANEOUSLY DAILY, Disp: 9 pen, Rfl: 3 .  amLODipine (NORVASC) 5 MG tablet, Take 1 tablet (5 mg total) by mouth daily., Disp: 90 tablet, Rfl: 3  Allergies  Allergen Reactions  . Oxycodone Itching     Review of Systems  Constitutional:  Positive for weight loss. Negative for fever, chills and malaise/fatigue.  HENT: Negative for hearing loss.   Eyes: Negative for blurred vision and double vision.  Respiratory: Negative for cough, shortness of breath and wheezing.   Cardiovascular: Negative for chest pain, palpitations and leg swelling.  Gastrointestinal: Negative for heartburn, abdominal pain and blood in stool.  Genitourinary: Negative for dysuria, urgency and frequency.  Musculoskeletal: Negative for myalgias and joint pain.  Skin: Negative for rash.  Neurological: Positive for tremors and weakness. Negative for dizziness, sensory change, focal weakness and headaches.      Objective  Filed Vitals:   01/19/16 0921 01/19/16 0946  BP: 162/75 150/70  Pulse: 67   Temp: 97.5 F (36.4 C)   Resp: 16   Height: _0  (1.854 m)   Weight: 232 lb 4.8 oz (105.371 kg)     Physical Exam  Constitutional: He is oriented to person, place, and time and well-developed, well-nourished, and in no distress. No distress.  HENT:  Head:  Normocephalic and atraumatic.  Eyes: Conjunctivae and EOM are normal. Pupils are equal, round, and reactive to light. No scleral icterus.  Neck: Normal range of motion. Neck supple. Carotid bruit is not present. No thyromegaly present.  Cardiovascular: Normal rate, regular rhythm and normal heart sounds.  Exam reveals no gallop and no friction rub.   No murmur heard. Pulmonary/Chest: Effort normal and breath sounds normal. No respiratory distress. He has no wheezes. He has no rales.  Abdominal: Soft. Bowel sounds are normal. He exhibits no distension and no mass. There is no tenderness.  Musculoskeletal: Normal range of motion. He exhibits no edema.  Lymphadenopathy:    He has no cervical adenopathy.  Neurological: He is alert and oriented to person, place, and time.  Vitals reviewed.      Recent Results (from the past 2160 hour(s))  POCT HgB A1C     Status: Abnormal   Collection Time: 01/19/16  9:32 AM  Result Value Ref Range   Hemoglobin A1C 6.3      Assessment & Plan  Problem List Items Addressed This Visit      Cardiovascular and Mediastinum   Hypertension   Relevant Medications   amLODipine (NORVASC) 5 MG tablet     Endocrine   Diabetes (HCC) - Primary   Relevant Medications   Insulin Glargine (LANTUS SOLOSTAR) 100 UNIT/ML Solostar Pen   glucose blood test strip   Other Relevant Orders   POCT HgB A1C (Completed)   Comprehensive Metabolic Panel (CMET)   CBC with Differential     Other   Sepsis due to Staphylococcus aureus (HCC)   Relevant Orders   Lipid Profile      Meds ordered this encounter  Medications  . Insulin Glargine (LANTUS SOLOSTAR) 100 UNIT/ML Solostar Pen    Sig: Inject 40 Units into the skin daily at 10 pm.    Dispense:  15 pen    Refill:  3  . amLODipine (NORVASC) 5 MG tablet    Sig: Take 1 tablet (5 mg total) by mouth daily.    Dispense:  90 tablet    Refill:  3  . glucose blood test strip    Sig: Apply 1 strip to machine to check  blood sugar twice daily. Dx.E11.9    Dispense:  100 each    Refill:  12    Dispense with One Touch Ultra Mini device   1. Type 2 diabetes mellitus without complication, unspecified long term insulin use status ([place])  -  POCT HgB A1C-6.3 - Comprehensive Metabolic Panel (CMET) - CBC with Differential  2. Essential hypertension  - amLODipine (NORVASC) 5 MG tablet; Take 1 tablet (5 mg total) by mouth daily.  Dispense: 90 tablet; Refill: 3  3. Sepsis due to Staphylococcus aureus ([place])  - Lipid Profile  4. Type 2 diabetes mellitus without complication, with long-term current use of insulin (HCC)  - Insulin Glargine (LANTUS SOLOSTAR) 100 UNIT/ML Solostar Pen; Inject 40 Units into the skin daily at 10 pm.  Dispense: 15 pen; Refill: 3 - glucose blood test strip; Apply 1 strip to machine to check blood sugar twice daily. Dx.E11.9  Dispense: 100 each; Refill: 12

## 2016-01-21 LAB — CBC WITH DIFFERENTIAL/PLATELET
BASOS ABS: 0.1 10*3/uL (ref 0.0–0.2)
Basos: 1 %
EOS (ABSOLUTE): 0.2 10*3/uL (ref 0.0–0.4)
EOS: 2 %
HEMATOCRIT: 41.3 % (ref 37.5–51.0)
Hemoglobin: 13.7 g/dL (ref 12.6–17.7)
IMMATURE GRANULOCYTES: 0 %
Immature Grans (Abs): 0 10*3/uL (ref 0.0–0.1)
LYMPHS ABS: 2.4 10*3/uL (ref 0.7–3.1)
Lymphs: 28 %
MCH: 29 pg (ref 26.6–33.0)
MCHC: 33.2 g/dL (ref 31.5–35.7)
MCV: 88 fL (ref 79–97)
MONOCYTES: 9 %
MONOS ABS: 0.8 10*3/uL (ref 0.1–0.9)
NEUTROS PCT: 60 %
Neutrophils Absolute: 5.3 10*3/uL (ref 1.4–7.0)
Platelets: 330 10*3/uL (ref 150–379)
RBC: 4.72 x10E6/uL (ref 4.14–5.80)
RDW: 14.7 % (ref 12.3–15.4)
WBC: 8.8 10*3/uL (ref 3.4–10.8)

## 2016-01-21 LAB — COMPREHENSIVE METABOLIC PANEL
ALK PHOS: 84 IU/L (ref 39–117)
ALT: 17 IU/L (ref 0–44)
AST: 18 IU/L (ref 0–40)
Albumin/Globulin Ratio: 1.2 (ref 1.1–2.5)
Albumin: 4 g/dL (ref 3.5–4.8)
BUN / CREAT RATIO: 15 (ref 10–22)
BUN: 13 mg/dL (ref 8–27)
Bilirubin Total: 0.5 mg/dL (ref 0.0–1.2)
CHLORIDE: 106 mmol/L (ref 96–106)
CO2: 23 mmol/L (ref 18–29)
CREATININE: 0.89 mg/dL (ref 0.76–1.27)
Calcium: 9.8 mg/dL (ref 8.6–10.2)
GFR calc Af Amer: 97 mL/min/{1.73_m2} (ref >59)
GFR calc non Af Amer: 84 mL/min/{1.73_m2} (ref >59)
GLUCOSE: 100 mg/dL — AB (ref 65–99)
Globulin, Total: 3.4 g/dL (ref 1.5–4.5)
Potassium: 4.7 mmol/L (ref 3.5–5.2)
SODIUM: 144 mmol/L (ref 134–144)
Total Protein: 7.4 g/dL (ref 6.0–8.5)

## 2016-01-21 LAB — LIPID PANEL
CHOL/HDL RATIO: 6 ratio — AB (ref 0.0–5.0)
CHOLESTEROL TOTAL: 169 mg/dL (ref 100–199)
HDL: 28 mg/dL — AB (ref >39)
LDL CALC: 116 mg/dL — AB (ref 0–99)
TRIGLYCERIDES: 126 mg/dL (ref 0–149)
VLDL CHOLESTEROL CAL: 25 mg/dL (ref 5–40)

## 2016-01-22 ENCOUNTER — Other Ambulatory Visit: Payer: Self-pay | Admitting: Family Medicine

## 2016-01-22 DIAGNOSIS — E785 Hyperlipidemia, unspecified: Secondary | ICD-10-CM

## 2016-01-22 MED ORDER — ATORVASTATIN CALCIUM 10 MG PO TABS: 10.0000 mg | ORAL_TABLET | Freq: Every day | ORAL | Status: DC

## 2016-02-12 ENCOUNTER — Other Ambulatory Visit: Payer: Self-pay | Admitting: Family Medicine

## 2016-03-02 ENCOUNTER — Encounter: Payer: Self-pay | Admitting: Family Medicine

## 2016-03-02 ENCOUNTER — Ambulatory Visit (INDEPENDENT_AMBULATORY_CARE_PROVIDER_SITE_OTHER): Payer: Medicare Other | Admitting: Family Medicine

## 2016-03-02 VITALS — BP 132/68 | HR 70 | Temp 98.2°F | Resp 16 | Ht 73.0 in | Wt 232.4 lb

## 2016-03-02 DIAGNOSIS — N181 Chronic kidney disease, stage 1: Secondary | ICD-10-CM

## 2016-03-02 DIAGNOSIS — I10 Essential (primary) hypertension: Secondary | ICD-10-CM | POA: Diagnosis not present

## 2016-03-02 DIAGNOSIS — E0822 Diabetes mellitus due to underlying condition with diabetic chronic kidney disease: Secondary | ICD-10-CM | POA: Diagnosis not present

## 2016-03-02 MED ORDER — INSULIN GLARGINE 300 UNIT/ML ~~LOC~~ SOPN: 40.0000 [IU] | PEN_INJECTOR | SUBCUTANEOUS | Status: DC

## 2016-03-02 NOTE — Progress Notes (Signed)
Name: Jay Brown   MRN: [id number]    DOB: 1941/09/11   Date:03/02/2016       Progress Note  Subjective  Chief Complaint  Chief Complaint  Patient presents with  . Hypertension    HPI Here for f/u of HBP and DM.  BPs at home in 130 sys. range and BSs run 100-115 range.  No problem-specific assessment & plan notes found for this encounter.   Past Medical History  Diagnosis Date  . Diabetes [facility])     Past Surgical History  Procedure Laterality Date  . Shoulder surgery      x5 1980s  . Neck surgery      1980s/90s  . 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 Wrist surgery      2005  . Back surgery      2012  . Cholecystectomy      Family History  Problem Relation Age of Onset  . Alzheimer's disease Mother   . Cancer Father     lung  . Diabetes Sister     Social History   Social History  . Marital Status: Married    Spouse Name: N/A  . Number of Children: N/A  . Years of Education: N/A   Occupational History  . Not on file.   Social History Main Topics  . Smoking status: Former Smoker -- 0.50 packs/day for 10 years    Types: Cigarettes  . Smokeless tobacco: Never Used  . Alcohol Use: No  . Drug Use: No  . Sexual Activity: Not on file   Other Topics Concern  . Not on file   Social History Narrative     Current outpatient prescriptions:  .  amLODipine (NORVASC) 5 MG tablet, Take 1 tablet (5 mg total) by mouth daily., Disp: 90 tablet, Rfl: 3 .  atorvastatin (LIPITOR) 10 MG tablet, Take 1 tablet (10 mg total) by mouth at bedtime., Disp: 30 tablet, Rfl: 12 .  bacitracin ointment, Apply topically., Disp: , Rfl:  .  Blood Glucose Monitoring Suppl (ONE TOUCH ULTRA SYSTEM KIT) W/DEVICE KIT, 1 kit by Does not apply route once., Disp: 1 each, Rfl: 0 .  ferrous sulfate 325 (65 FE) MG EC tablet, Take 325 mg by mouth daily., Disp: , Rfl:  .  fluorouracil (EFUDEX) 5 % cream, , Disp: , Rfl:  .  glucose blood test strip, Apply 1 strip to machine to  check blood sugar twice daily. Dx.E11.9, Disp: 100 each, Rfl: 12 .  Lancets (ONETOUCH ULTRASOFT) lancets, Insert lancet to prick finger once daily. Dx. E11.9, Disp: 100 each, Rfl: 12 .  lisinopril (PRINIVIL,ZESTRIL) 40 MG tablet, TAKE ONE TABLET BY MOUTH ONCE DAILY RESTART, Disp: 90 tablet, Rfl: 3 .  naproxen (NAPROSYN) 250 MG tablet, Take 250 mg by mouth., Disp: , Rfl:  .  Oxacillin Sodium 10 g SOLR, , Disp: , Rfl:  .  tiZANidine (ZANAFLEX) 2 MG tablet, Take 2 mg by mouth., Disp: , Rfl:  .  VICTOZA 18 MG/3ML SOPN, INJECT 1.8MG SUBCUTANEOUSLY DAILY, Disp: 9 pen, Rfl: 3 .  Insulin Glargine (TOUJEO SOLOSTAR) 300 UNIT/ML SOPN, Inject 40 Units into the skin daily after supper., Disp: 12 pen, Rfl: 3  Allergies  Allergen Reactions  . Oxycodone Itching     Review of Systems  Constitutional: Negative for fever, chills, weight loss and malaise/fatigue.  HENT: Negative for hearing loss.   Eyes: Negative for blurred vision and double vision.  Respiratory: Negative  for cough, shortness of breath and wheezing.   Cardiovascular: Negative for chest pain, palpitations and leg swelling.  Gastrointestinal: Positive for abdominal pain (bloating) and diarrhea. Negative for heartburn and blood in stool.  Genitourinary: Negative for dysuria, urgency and frequency.  Skin: Negative for rash.  Neurological: Negative for tremors, weakness and headaches.      Objective  Filed Vitals:   03/02/16 0758  BP: 132/68  Pulse: 70  Temp: 98.2 F (36.8 C)  TempSrc: Oral  Resp: 16  Height: _0  (1.854 m)  Weight: 232 lb 6.4 oz (105.416 kg)    Physical Exam  Constitutional: He is oriented to person, place, and time and well-developed, well-nourished, and in no distress. No distress.  HENT:  Head: Normocephalic and atraumatic.  Eyes: Conjunctivae and EOM are normal. Pupils are equal, round, and reactive to light. No scleral icterus.  Neck: Normal range of motion. Neck supple. Carotid bruit is not present.  No thyromegaly present.  Cardiovascular: Normal rate, regular rhythm and normal heart sounds.  Exam reveals no gallop and no friction rub.   No murmur heard. Pulmonary/Chest: Effort normal and breath sounds normal. No respiratory distress. He has no wheezes. He has no rales.  Abdominal: Soft. Bowel sounds are normal. He exhibits no distension and no mass. There is no tenderness.  Musculoskeletal: He exhibits no edema.  Lymphadenopathy:    He has no cervical adenopathy.  Neurological: He is alert and oriented to person, place, and time.  Vitals reviewed.      Recent Results (from the past 2160 hour(s))  POCT HgB A1C     Status: Abnormal   Collection Time: 01/19/16  9:32 AM  Result Value Ref Range   Hemoglobin A1C 6.3   Comprehensive Metabolic Panel (CMET)     Status: Abnormal   Collection Time: 01/20/16  8:10 AM  Result Value Ref Range   Glucose 100 (H) 65 - 99 mg/dL   BUN 13 8 - 27 mg/dL   Creatinine, Ser 0.89 0.76 - 1.27 mg/dL   GFR calc non Af Amer 84 >59 mL/min/1.73   GFR calc Af Amer 97 >59 mL/min/1.73   BUN/Creatinine Ratio 15 10 - 22   Sodium 144 134 - 144 mmol/L   Potassium 4.7 3.5 - 5.2 mmol/L   Chloride 106 96 - 106 mmol/L   CO2 23 18 - 29 mmol/L   Calcium 9.8 8.6 - 10.2 mg/dL   Total Protein 7.4 6.0 - 8.5 g/dL   Albumin 4.0 3.5 - 4.8 g/dL   Globulin, Total 3.4 1.5 - 4.5 g/dL   Albumin/Globulin Ratio 1.2 1.1 - 2.5    Comment: **Effective February 09, 2016 the reference interval**   for A/G Ratio will be changing to:              Age                Male          Male           0 -  7 days       1.1 - 2.3       1.1 - 2.3           8 - 30 days       1.2 - 2.8       1.2 - 2.8           1 -  6 months     1.3 - 3.6  1.3 - 3.6    7 months -  5 years      1.5 - 2.6       1.5 - 2.6              > 5 years      1.2 - 2.2       1.2 - 2.2    Bilirubin Total 0.5 0.0 - 1.2 mg/dL   Alkaline Phosphatase 84 39 - 117 IU/L   AST 18 0 - 40 IU/L   ALT 17 0 - 44 IU/L  CBC  with Differential     Status: None   Collection Time: 01/20/16  8:10 AM  Result Value Ref Range   WBC 8.8 3.4 - 10.8 x10E3/uL   RBC 4.72 4.14 - 5.80 x10E6/uL   Hemoglobin 13.7 12.6 - 17.7 g/dL   Hematocrit 41.3 37.5 - 51.0 %   MCV 88 79 - 97 fL   MCH 29.0 26.6 - 33.0 pg   MCHC 33.2 31.5 - 35.7 g/dL   RDW 14.7 12.3 - 15.4 %   Platelets 330 150 - 379 x10E3/uL   Neutrophils 60 %   Lymphs 28 %   Monocytes 9 %   Eos 2 %   Basos 1 %   Neutrophils Absolute 5.3 1.4 - 7.0 x10E3/uL   Lymphocytes Absolute 2.4 0.7 - 3.1 x10E3/uL   Monocytes Absolute 0.8 0.1 - 0.9 x10E3/uL   EOS (ABSOLUTE) 0.2 0.0 - 0.4 x10E3/uL   Basophils Absolute 0.1 0.0 - 0.2 x10E3/uL   Immature Granulocytes 0 %   Immature Grans (Abs) 0.0 0.0 - 0.1 x10E3/uL  Lipid Profile     Status: Abnormal   Collection Time: 01/20/16  8:10 AM  Result Value Ref Range   Cholesterol, Total 169 100 - 199 mg/dL   Triglycerides 126 0 - 149 mg/dL   HDL 28 (L) >39 mg/dL   VLDL Cholesterol Cal 25 5 - 40 mg/dL   LDL Calculated 116 (H) 0 - 99 mg/dL   Chol/HDL Ratio 6.0 (H) 0.0 - 5.0 ratio units    Comment:                                   T. Chol/HDL Ratio                                             Men  Women                               1/2 Avg.Risk  3.4    3.3                                   Avg.Risk  5.0    4.4                                2X Avg.Risk  9.6    7.1                                3X Avg.Risk 23.4   11.0  Assessment & Plan  Problem List Items Addressed This Visit      Cardiovascular and Mediastinum   Hypertension - Primary     Endocrine   Diabetes ([place])   Relevant Medications   Insulin Glargine (TOUJEO SOLOSTAR) 300 UNIT/ML SOPN      Meds ordered this encounter  Medications  . fluorouracil (EFUDEX) 5 % cream    Sig:   . Insulin Glargine (TOUJEO SOLOSTAR) 300 UNIT/ML SOPN    Sig: Inject 40 Units into the skin daily after supper.    Dispense:  12 pen    Refill:  3   1. Essential  hypertension Cont. Lisinopril and Amlodipine  2. 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 Victoza - Insulin Glargine (TOUJEO SOLOSTAR) 300 UNIT/ML SOPN; Inject 40 Units into the skin daily after supper.  Dispense: 12 pen; Refill: 3 (replaces Lantus)

## 2016-03-08 ENCOUNTER — Other Ambulatory Visit: Payer: Self-pay | Admitting: Family Medicine

## 2016-03-08 DIAGNOSIS — E0822 Diabetes mellitus due to underlying condition with diabetic chronic kidney disease: Secondary | ICD-10-CM

## 2016-03-08 DIAGNOSIS — N181 Chronic kidney disease, stage 1: Principal | ICD-10-CM

## 2016-03-08 MED ORDER — INSULIN GLARGINE 300 UNIT/ML ~~LOC~~ SOPN: 40.0000 [IU] | PEN_INJECTOR | SUBCUTANEOUS | Status: DC

## 2016-03-18 ENCOUNTER — Other Ambulatory Visit: Payer: Self-pay | Admitting: Family Medicine

## 2016-03-18 DIAGNOSIS — N181 Chronic kidney disease, stage 1: Principal | ICD-10-CM

## 2016-03-18 DIAGNOSIS — E0822 Diabetes mellitus due to underlying condition with diabetic chronic kidney disease: Secondary | ICD-10-CM

## 2016-03-18 MED ORDER — INSULIN GLARGINE 300 UNIT/ML ~~LOC~~ SOPN: 40.0000 [IU] | PEN_INJECTOR | SUBCUTANEOUS | Status: DC

## 2016-03-22 ENCOUNTER — Ambulatory Visit: Payer: Medicare PPO | Admitting: Family Medicine

## 2016-04-13 ENCOUNTER — Ambulatory Visit: Payer: Medicare PPO | Admitting: Family Medicine

## 2016-04-22 ENCOUNTER — Ambulatory Visit (INDEPENDENT_AMBULATORY_CARE_PROVIDER_SITE_OTHER): Payer: Medicare Other

## 2016-04-22 ENCOUNTER — Encounter: Payer: Self-pay | Admitting: Gynecology

## 2016-04-22 ENCOUNTER — Ambulatory Visit
Admission: EM | Admit: 2016-04-22 | Discharge: 2016-04-22 | Disposition: A | Discharge status: Home / Self Care | Payer: Medicare Other | Attending: Emergency Medicine | Admitting: Emergency Medicine

## 2016-04-22 DIAGNOSIS — S6992XA Unspecified injury of left wrist, hand and finger(s), initial encounter: Secondary | ICD-10-CM

## 2016-04-22 DIAGNOSIS — M25532 Pain in left wrist: Secondary | ICD-10-CM

## 2016-04-22 NOTE — Discharge Instructions (Signed)
Follow-up with an orthopedic surgeon of your choice if not getting better in several weeks. Keep it immobilized take it out several times a day to prevent your wrist from becoming stiff . 400-600 mg of ibuprofen with 1 g of Tylenol 3-4 times a day as needed for pain. Go to the ER for the signs and symptoms we discussed.

## 2016-04-22 NOTE — ED Notes (Signed)
Patient c/o fell a week ago and injury left wrist.  Per patient pain in left wrist going straight to his shoulder.

## 2016-04-22 NOTE — ED Provider Notes (Signed)
HPI  SUBJECTIVE:  Jay Brown is a R handed 75 y.o. male who presents with throbbing, dull, intermittent left wrist pain after having a trip and fall 10 days ago. States that he fell sideways onto an outstretched wrist. States he felt a "pop" followed by a "knot". States the pain is now radiating to the forearm and elbow. Reports swelling. There are no alleviating factors. Symptoms are worse at night and with movement. He has not tried anything for this. No numbness, tingling, bruising, fevers. No other injury to his hand, forearm, wrist or shoulder. Past medical history of sepsis that "settled in his left shoulder", diabetes, hypertension. No history of kidney disease, smoking. PMD: Dr. Luan Pulling.   Past Medical History  Diagnosis Date  . Diabetes [facility])     Past Surgical History  Procedure Laterality Date  . Shoulder surgery      x5 1980s  . Neck surgery      1980s/90s  . 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 Wrist surgery      2005  . Back surgery      2012  . Cholecystectomy      Family History  Problem Relation Age of Onset  . Alzheimer's disease Mother   . Cancer Father     lung  . Diabetes Sister     Social History  Substance Use Topics  . Smoking status: Former Smoker -- 0.50 packs/day for 10 years    Types: Cigarettes  . Smokeless tobacco: Never Used  . Alcohol Use: No    No current facility-administered medications for this encounter.  Current outpatient prescriptions:  .  amLODipine (NORVASC) 5 MG tablet, Take 1 tablet (5 mg total) by mouth daily., Disp: 90 tablet, Rfl: 3 .  atorvastatin (LIPITOR) 10 MG tablet, Take 1 tablet (10 mg total) by mouth at bedtime., Disp: 30 tablet, Rfl: 12 .  Blood Glucose Monitoring Suppl (ONE TOUCH ULTRA SYSTEM KIT) W/DEVICE KIT, 1 kit by Does not apply route once., Disp: 1 each, Rfl: 0 .  glucose blood test strip, Apply 1 strip to machine to check blood sugar twice daily. Dx.E11.9, Disp: 100 each, Rfl: 12 .   Insulin Glargine (TOUJEO SOLOSTAR) 300 UNIT/ML SOPN, Inject 40 Units into the skin daily after supper., Disp: 15 pen, Rfl: 3 .  Lancets (ONETOUCH ULTRASOFT) lancets, Insert lancet to prick finger once daily. Dx. E11.9, Disp: 100 each, Rfl: 12 .  lisinopril (PRINIVIL,ZESTRIL) 40 MG tablet, TAKE ONE TABLET BY MOUTH ONCE DAILY RESTART, Disp: 90 tablet, Rfl: 3 .  VICTOZA 18 MG/3ML SOPN, INJECT 1.8MG SUBCUTANEOUSLY DAILY, Disp: 9 pen, Rfl: 3 .  bacitracin ointment, Apply topically., Disp: , Rfl:  .  ferrous sulfate 325 (65 FE) MG EC tablet, Take 325 mg by mouth daily., Disp: , Rfl:  .  fluorouracil (EFUDEX) 5 % cream, , Disp: , Rfl:  .  naproxen (NAPROSYN) 250 MG tablet, Take 250 mg by mouth., Disp: , Rfl:  .  Oxacillin Sodium 10 g SOLR, , Disp: , Rfl:  .  tiZANidine (ZANAFLEX) 2 MG tablet, Take 2 mg by mouth., Disp: , Rfl:   Allergies  Allergen Reactions  . Oxycodone Itching     ROS  As noted in HPI.   Physical Exam  BP 138/71 mmHg  Pulse 67  Temp(Src) 97.8 F (36.6 C) (Oral)  Resp 18  Ht 6' 2"  (1.88 m)  Wt 230 lb (104.327 kg)  BMI 29.52 kg/m2  SpO2 99%  Constitutional: Well developed, well nourished, no acute distress Eyes:  EOMI, conjunctiva normal bilaterally HENT: Normocephalic, atraumatic,mucus membranes moist Respiratory: Normal inspiratory effort Cardiovascular: Normal rate GI: nondistended skin: No rash, skin intact Musculoskeletal L Wrist Tenderness At distal radius and along snuffbox, distal ulnar styloid NT, carpals NT, metacarpals NT , digits NT, Motor intact ability to flex / extend digits of affected hand, Sensation LT to hand normal , CR<2 seconds distally. Pain with wrist flexion. No pain with extension, radial or ulnar deviation.  No bruising, swelling, erythema. No evidence of infection. 2 fingernails missing, but nontender, no surrounding erythema, expressible purulent discharge. Shoulder and upper arm NT, Elbow and proximal forearm NT Neurologic: Alert &  oriented x 3, no focal neuro deficits Psychiatric: Speech and behavior appropriate   ED Course   Medications - No data to display  Orders Placed This Encounter  Procedures  . DG Wrist Complete Left    Standing Status: Standing     Number of Occurrences: 1     Standing Expiration Date:     Order Specific Question:  Reason for Exam (SYMPTOM  OR DIAGNOSIS REQUIRED)    Answer:  fall distal radisu snoffbox tenderness r/o fx  . Splint wrist    Standing Status: Standing     Number of Occurrences: 1     Standing Expiration Date:     Order Specific Question:  Laterality    Answer:  Left    No results found for this or any previous visit (from the past 24 hour(s)). Dg Wrist Complete Left  04/22/2016  CLINICAL DATA:  Left wrist pain following a fall 1 week ago. EXAM: LEFT WRIST - COMPLETE 3+ VIEW COMPARISON:  None. FINDINGS: There is no evidence of fracture or dislocation. There is no evidence of arthropathy or other focal bone abnormality. Soft tissues are unremarkable. IMPRESSION: Normal examination. Electronically Signed   By: Claudie Revering M.D.   On: 04/22/2016 09:09    ED Clinical Impression  Wrist injury, left, initial encounter  Wrist pain, acute, left   ED Assessment/Plan  Patient is neurovascularly intact.  Checking  wrist x-ray to r/o distal radius or snuffbox fracture.   Reviewed imaging independently. No fracture, dislocation. See radiology report for details.   Plan to splint, refer to Dr. Mack Guise, orthopedics for evaluation PRN. Patient is 10 days post injury, so any occult fracture should have showed up by now. Home with Tylenol, low-dose ibuprofen. She declined prescription pain medicine.  Discussed imaging, MDM, plan and followup with patient. Discussed sn/sx that should prompt return to the  ED. Patient  agrees with plan.   *This clinic note was created using Dragon dictation software. Therefore, there may be occasional mistakes despite careful  proofreading.  ?    Melynda Ripple, MD 04/22/16 [phone]

## 2016-06-10 ENCOUNTER — Encounter: Payer: Self-pay | Admitting: Family Medicine

## 2016-06-10 ENCOUNTER — Ambulatory Visit (INDEPENDENT_AMBULATORY_CARE_PROVIDER_SITE_OTHER): Payer: Medicare Other | Admitting: Family Medicine

## 2016-06-10 VITALS — BP 107/68 | HR 71 | Temp 97.5°F | Resp 16 | Ht 73.0 in | Wt 233.0 lb

## 2016-06-10 DIAGNOSIS — N181 Chronic kidney disease, stage 1: Secondary | ICD-10-CM | POA: Diagnosis not present

## 2016-06-10 DIAGNOSIS — E119 Type 2 diabetes mellitus without complications: Secondary | ICD-10-CM | POA: Diagnosis not present

## 2016-06-10 DIAGNOSIS — E0822 Diabetes mellitus due to underlying condition with diabetic chronic kidney disease: Secondary | ICD-10-CM

## 2016-06-10 DIAGNOSIS — I10 Essential (primary) hypertension: Secondary | ICD-10-CM | POA: Diagnosis not present

## 2016-06-10 LAB — POCT GLYCOSYLATED HEMOGLOBIN (HGB A1C): Hemoglobin A1C: 7.1

## 2016-06-10 MED ORDER — INSULIN GLARGINE 300 UNIT/ML ~~LOC~~ SOPN: 42.0000 [IU] | PEN_INJECTOR | SUBCUTANEOUS | Status: DC

## 2016-06-10 MED ORDER — AMLODIPINE BESYLATE 5 MG PO TABS: ORAL_TABLET | ORAL | Status: DC

## 2016-06-10 NOTE — Progress Notes (Signed)
Name: Jay Brown   MRN: [id number]    DOB: [birth year]   Date:06/10/2016       Progress Note  Subjective  Chief Complaint  Chief Complaint  Patient presents with  . Diabetes    HPI Here for f/u of HBP and DM.  BSs at home run 120s-140s usually,  BP at home 120-130 sys.  No problem-specific assessment & plan notes found for this encounter.   Past Medical History  Diagnosis Date  . Diabetes [facility])     Past Surgical History  Procedure Laterality Date  . Shoulder surgery      x5 1980s  . Neck surgery      1980s/90s  . 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 Wrist surgery      2005  . Back surgery      2012  . Cholecystectomy      Family History  Problem Relation Age of Onset  . Alzheimer's disease Mother   . Cancer Father     lung  . Diabetes Sister     Social History   Social History  . Marital Status: Married    Spouse Name: N/A  . Number of Children: N/A  . Years of Education: N/A   Occupational History  . Not on file.   Social History Main Topics  . Smoking status: Former Smoker -- 0.50 packs/day for 10 years    Types: Cigarettes  . Smokeless tobacco: Never Used  . Alcohol Use: No  . Drug Use: No  . Sexual Activity: Not on file   Other Topics Concern  . Not on file   Social History Narrative     Current outpatient prescriptions:  .  amLODipine (NORVASC) 5 MG tablet, Take 1/2 tablet each morning for BP., Disp: 90 tablet, Rfl: 3 .  atorvastatin (LIPITOR) 10 MG tablet, Take 1 tablet (10 mg total) by mouth at bedtime., Disp: 30 tablet, Rfl: 12 .  Blood Glucose Monitoring Suppl (ONE TOUCH ULTRA SYSTEM KIT) W/DEVICE KIT, 1 kit by Does not apply route once., Disp: 1 each, Rfl: 0 .  glucose blood test strip, Apply 1 strip to machine to check blood sugar twice daily. Dx.E11.9, Disp: 100 each, Rfl: 12 .  Insulin Glargine (TOUJEO SOLOSTAR) 300 UNIT/ML SOPN, Inject 42 Units into the skin daily after supper., Disp: 15 pen, Rfl: 3 .  Insulin  Glargine (TOUJEO SOLOSTAR) 300 UNIT/ML SOPN, Inject 42 Units into the skin daily after supper., Disp: 15 pen, Rfl: 3 .  Lancets (ONETOUCH ULTRASOFT) lancets, Insert lancet to prick finger once daily. Dx. E11.9, Disp: 100 each, Rfl: 12 .  lisinopril (PRINIVIL,ZESTRIL) 40 MG tablet, TAKE ONE TABLET BY MOUTH ONCE DAILY RESTART, Disp: 90 tablet, Rfl: 3 .  VICTOZA 18 MG/3ML SOPN, INJECT 1.8MG SUBCUTANEOUSLY DAILY, Disp: 9 pen, Rfl: 3  Allergies  Allergen Reactions  . Oxycodone Itching     Review of Systems  Constitutional: Negative for fever, chills, weight loss and malaise/fatigue.  HENT: Negative for hearing loss.   Eyes: Negative for blurred vision and double vision.  Respiratory: Negative for cough, shortness of breath and wheezing.   Cardiovascular: Negative for chest pain, palpitations and leg swelling.  Gastrointestinal: Negative for heartburn, abdominal pain and blood in stool.  Genitourinary: Negative for dysuria, urgency and frequency.  Musculoskeletal: Negative for myalgias.  Skin: Negative for rash.  Neurological: Negative for dizziness, tremors, weakness and headaches.  Psychiatric/Behavioral: Negative for depression. The patient is not  nervous/anxious and does not have insomnia.       Objective  Filed Vitals:   06/10/16 0825  BP: 107/68  Pulse: 71  Temp: 97.5 F (36.4 C)  TempSrc: Oral  Resp: 16  Height: 6' 1" (1.854 m)  Weight: 233 lb (105.688 kg)    Physical Exam  Constitutional: He is oriented to person, place, and time and well-developed, well-nourished, and in no distress. No distress.  HENT:  Head: Normocephalic and atraumatic.  Eyes: Conjunctivae and EOM are normal. Pupils are equal, round, and reactive to light.  Neck: Normal range of motion. Neck supple. Carotid bruit is not present. No thyromegaly present.  Cardiovascular: Normal rate, regular rhythm and normal heart sounds.  Exam reveals no gallop and no friction rub.   No murmur  heard. Pulmonary/Chest: Effort normal and breath sounds normal. No respiratory distress. He has no wheezes. He has no rales.  Abdominal: Soft. Bowel sounds are normal. He exhibits no distension, no abdominal bruit and no mass. There is no tenderness.  Musculoskeletal: He exhibits no edema.  Lymphadenopathy:    He has no cervical adenopathy.  Neurological: He is alert and oriented to person, place, and time.  Vitals reviewed.      No results found for this or any previous visit (from the past 2160 hour(s)).   Assessment & Plan  Problem List Items Addressed This Visit      Cardiovascular and Mediastinum   Hypertension   Relevant Medications   amLODipine (NORVASC) 5 MG tablet     Endocrine   Diabetes (HCC) - Primary   Relevant Medications   Insulin Glargine (TOUJEO SOLOSTAR) 300 UNIT/ML SOPN   Insulin Glargine (TOUJEO SOLOSTAR) 300 UNIT/ML SOPN   Other Relevant Orders   POCT HgB A1C      Meds ordered this encounter  Medications  . DISCONTD: amLODipine (NORVASC) 5 MG tablet    Sig: Take 1/2 tablet each morning for BP.    Dispense:  90 tablet    Refill:  3  . DISCONTD: Insulin Glargine (TOUJEO SOLOSTAR) 300 UNIT/ML SOPN    Sig: Inject 42 Units into the skin daily after supper.    Dispense:  15 pen    Refill:  3  . Insulin Glargine (TOUJEO SOLOSTAR) 300 UNIT/ML SOPN    Sig: Inject 42 Units into the skin daily after supper.    Dispense:  15 pen    Refill:  3  . Insulin Glargine (TOUJEO SOLOSTAR) 300 UNIT/ML SOPN    Sig: Inject 42 Units into the skin daily after supper.    Dispense:  15 pen    Refill:  3  . amLODipine (NORVASC) 5 MG tablet    Sig: Take 1/2 tablet each morning for BP.    Dispense:  90 tablet    Refill:  3    1. Type 2 diabetes mellitus without complication, unspecified long term insulin use status (HCC) Increase Toujeo to 42 units in PM. Cont Vicxoza - POCT HgB A1C-7.1  2. Essential hypertension Cont Lisinolpril - amLODipine (NORVASC) 5 MG  tablet; Take 1/2 tablet each morning for BP.  Dispense: 90 tablet; Refill: 3  3. 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  Dispense: 15 pen; Refill: 3

## 2016-07-06 ENCOUNTER — Ambulatory Visit (INDEPENDENT_AMBULATORY_CARE_PROVIDER_SITE_OTHER): Payer: Medicare Other | Admitting: Family Medicine

## 2016-07-06 ENCOUNTER — Encounter: Payer: Self-pay | Admitting: Family Medicine

## 2016-07-06 VITALS — BP 133/77 | HR 64 | Temp 97.8°F | Ht 74.0 in | Wt 233.0 lb

## 2016-07-06 DIAGNOSIS — E0800 Diabetes mellitus due to underlying condition with hyperosmolarity without nonketotic hyperglycemic-hyperosmolar coma (NKHHC): Secondary | ICD-10-CM

## 2016-07-06 DIAGNOSIS — Z0000 Encounter for general adult medical examination without abnormal findings: Secondary | ICD-10-CM

## 2016-07-06 NOTE — Patient Instructions (Signed)
Health Maintenance  Topic Date Due  . INFLUENZA VACCINE  10/29/2016 (Originally 06/29/2016)  . OPHTHALMOLOGY EXAM  11/29/2016 (Originally 06/29/2013)  . HEMOGLOBIN A1C  12/11/2016  . FOOT EXAM  03/18/2017  . COLONOSCOPY  06/29/2022  . TETANUS/TDAP  01/21/2024  . ZOSTAVAX  Completed  . PNA vac Low Risk Adult  Addressed

## 2016-07-06 NOTE — Progress Notes (Signed)
Name: LISTER CAPELLE   MRN: [id number]    DOB: 08-10-1941   Date:07/06/2016       Progress Note  Subjective  Chief Complaint  Chief Complaint  Patient presents with  . Medicare Wellness    HPI     Allergies  Allergen Reactions  . Oxycodone Itching     ROS    Objective  Vitals:   07/06/16 1007  BP: 133/77  Pulse: 64  Temp: 97.8 F (36.6 C)  TempSrc: Oral  Weight: 233 lb (105.7 kg)  Height: 6\' 2"  (1.88 m)    Physical Exam       Assessment & Plan

## 2016-07-06 NOTE — Progress Notes (Signed)
Patient: Jay Brown, Male    DOB: [birth year], 75 y.o.   MRN: [id number] Visit Date: 07/06/2016  Today's Provider: Dicky Doe, MD   Chief Complaint  Patient presents with  . Medicare Wellness    Subjective:    Annual wellness visit Jay Brown is a 75 y.o. male who presents today for his Subsequent Annual Wellness Visit. He feels well. He reports exercising. He reports he is sleeping well.   ----------------------------------------------------------- HPI Here for Medicare Wellness visit Review of Systems  Social History   Social History  . Marital status: Married    Spouse name: N/A  . Number of children: N/A  . Years of education: N/A   Occupational History  . Not on file.   Social History Main Topics  . Smoking status: Former Smoker    Packs/day: 0.50    Years: 10.00    Types: Cigarettes  . Smokeless tobacco: Never Used  . Alcohol use No  . Drug use: No  . Sexual activity: Not on file   Other Topics Concern  . Not on file   Social History Narrative  . No narrative on file    Patient Active Problem List   Diagnosis Date Noted  . Medicare annual wellness visit, subsequent 07/06/2016  . Bacteremia due to Gram-positive bacteria 11/20/2015  . Sepsis due to Staphylococcus aureus ([place]) 11/20/2015  . Pain in shoulder 11/19/2015  . Chronic diarrhea 10/15/2015  . Diabetes ([place]) 08/25/2015  . Hypertension 08/25/2015    Past Surgical History:  Procedure Laterality Date  . BACK SURGERY     2012  . CHOLECYSTECTOMY    . NECK SURGERY     1980s/90s  . REPLACEMENT TOTAL KNEE     lt 1999 rt 2001  . SHOULDER SURGERY     x5 1980s  . WRIST SURGERY     2005    His family history includes Alzheimer's disease in his mother; Cancer in his father; Diabetes in his sister.    Previous Medications   AMLODIPINE (NORVASC) 5 MG TABLET    Take 1/2 tablet each morning for BP.   ATORVASTATIN (LIPITOR) 10 MG TABLET    Take 1 tablet (10 mg total) by mouth at  bedtime.   BLOOD GLUCOSE MONITORING SUPPL (ONE TOUCH ULTRA SYSTEM KIT) W/DEVICE KIT    1 kit by Does not apply route once.   GLUCOSE BLOOD TEST STRIP    Apply 1 strip to machine to check blood sugar twice daily. Dx.E11.9   INSULIN GLARGINE (TOUJEO SOLOSTAR) 300 UNIT/ML SOPN    Inject 42 Units into the skin daily after supper.   LANCETS (ONETOUCH ULTRASOFT) LANCETS    Insert lancet to prick finger once daily. Dx. E11.9   LISINOPRIL (PRINIVIL,ZESTRIL) 40 MG TABLET    TAKE ONE TABLET BY MOUTH ONCE DAILY RESTART   VICTOZA 18 MG/3ML SOPN    INJECT 1.8MG SUBCUTANEOUSLY DAILY    Patient Care Team: Arlis Porta., MD as PCP - General (Family Medicine)     Objective:   Vitals: BP 133/77 (BP Location: Left Arm, Patient Position: Sitting, Cuff Size: Large)   Pulse 64   Temp 97.8 F (36.6 C) (Oral)   Ht 6' 2"  (1.88 m)   Wt 233 lb (105.7 kg)   BMI 29.92 kg/m   Physical Exam  Vitals reviewed.   Activities of Daily Living In your present state of health, do you have any difficulty performing the following activities: 07/06/2016 01/19/2016  Hearing? N  N  Vision? N N  Difficulty concentrating or making decisions? N N  Walking or climbing stairs? N N  Dressing or bathing? N N  Doing errands, shopping? N N  Preparing Food and eating ? N -  Using the Toilet? N -  In the past six months, have you accidently leaked urine? N -  Do you have problems with loss of bowel control? N -  Managing your Medications? N -  Managing your Finances? N -  Housekeeping or managing your Housekeeping? N -  Some recent data might be hidden    Fall Risk Assessment Fall Risk  07/06/2016 07/06/2016 01/19/2016 07/09/2015  Falls in the past year? Yes No No Yes  Number falls in past yr: 2 or more - - 2 or more  Injury with Fall? No - - No  Risk for fall due to : - - - Other (Comment)     Patient reports there are not safety devices in place in shower at home.   Depression Screen PHQ 2/9 Scores 07/06/2016  01/19/2016 07/09/2015  PHQ - 2 Score 0 0 0     MMSE MMSE - Mini Mental State Exam 07/06/2016  Orientation to time 5  Orientation to Place 5  Registration 3  Attention/ Calculation 3  Recall 3  Language- name 2 objects 2  Language- repeat 1  Language- follow 3 step command 3  Language- read & follow direction 1  Write a sentence 1  Copy design 1  Total score 28     Assessment & Plan:     Annual Wellness Visit  Reviewed patient's Family Medical History Reviewed and updated list of patient's medical providers Assessment of cognitive impairment was done Assessed patient's functional ability Established a written schedule for health screening [place] Completed and Reviewed  Exercise Activities and Dietary recommendations Goals    . 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       Immunization History  Administered Date(s) Administered  . DTaP 07/08/2010  . Influenza-Unspecified 08/29/2014  . Pneumococcal Conjugate-13 09/18/2014  . Pneumococcal Polysaccharide-23 06/30/2010    Health Maintenance  Topic Date Due  . INFLUENZA VACCINE  10/29/2016 (Originally 06/29/2016)  . OPHTHALMOLOGY EXAM  11/29/2016 (Originally 06/29/2013)  . HEMOGLOBIN A1C  12/11/2016  . FOOT EXAM  03/18/2017  . COLONOSCOPY  06/29/2022  . TETANUS/TDAP  01/21/2024  . ZOSTAVAX  Completed  . PNA vac Low Risk Adult  Addressed     Discussed health benefits of physical activity, and encouraged him to engage in regular exercise appropriate for his age and condition.    ------------------------------------------------------------------------------------------------------------   Problem List Items Addressed This Visit      Endocrine   Diabetes [facility])   Relevant Orders   Ambulatory referral to Ophthalmology     Other   Medicare annual wellness visit, subsequent -  Primary    Other Visit Diagnoses   None.      Larene Beach, MD [facility]  07/06/2016

## 2016-07-23 ENCOUNTER — Other Ambulatory Visit: Payer: Self-pay | Admitting: Family Medicine

## 2016-07-23 DIAGNOSIS — E0822 Diabetes mellitus due to underlying condition with diabetic chronic kidney disease: Secondary | ICD-10-CM

## 2016-07-23 DIAGNOSIS — N181 Chronic kidney disease, stage 1: Principal | ICD-10-CM

## 2016-07-23 MED ORDER — INSULIN GLARGINE 300 UNIT/ML ~~LOC~~ SOPN: 42.0000 [IU] | PEN_INJECTOR | SUBCUTANEOUS | 3 refills | Status: DC

## 2016-09-14 ENCOUNTER — Encounter: Payer: Self-pay | Admitting: Family Medicine

## 2016-09-14 ENCOUNTER — Ambulatory Visit (INDEPENDENT_AMBULATORY_CARE_PROVIDER_SITE_OTHER): Payer: Medicare Other | Admitting: Family Medicine

## 2016-09-14 VITALS — BP 125/60 | HR 67 | Temp 98.3°F | Resp 16 | Ht 75.0 in | Wt 237.0 lb

## 2016-09-14 DIAGNOSIS — K529 Noninfective gastroenteritis and colitis, unspecified: Secondary | ICD-10-CM | POA: Diagnosis not present

## 2016-09-14 DIAGNOSIS — N181 Chronic kidney disease, stage 1: Secondary | ICD-10-CM | POA: Diagnosis not present

## 2016-09-14 DIAGNOSIS — I10 Essential (primary) hypertension: Secondary | ICD-10-CM | POA: Diagnosis not present

## 2016-09-14 DIAGNOSIS — E0822 Diabetes mellitus due to underlying condition with diabetic chronic kidney disease: Secondary | ICD-10-CM

## 2016-09-14 DIAGNOSIS — E119 Type 2 diabetes mellitus without complications: Secondary | ICD-10-CM

## 2016-09-14 LAB — POCT GLYCOSYLATED HEMOGLOBIN (HGB A1C): Hemoglobin A1C: 8

## 2016-09-14 MED ORDER — INSULIN GLARGINE 300 UNIT/ML ~~LOC~~ SOPN: 46.0000 [IU] | PEN_INJECTOR | SUBCUTANEOUS | 3 refills | Status: DC

## 2016-09-14 NOTE — Progress Notes (Signed)
Name: Jay Brown   MRN: [id number]    DOB: February 12, 1941   Date:09/14/2016       Progress Note  Subjective  Chief Complaint  Chief Complaint  Patient presents with  . Follow-up    Diabetes  . Diabetes    HPI Here for f/u of HBP and DM.  His main problem is chronic diarrhea.  Still with diarrhea almost daily from 3 AM until 8 AM.  His BSs have been up some.  He stopped Victoza for about 2 weeks to see if made Diarrhea  better.  It helped some.  He has been back on it for past week.  BSs back in 130s fasting now.  No problem-specific Assessment & Plan notes found for this encounter.   Past Medical History:  Diagnosis Date  . Diabetes [facility])     Past Surgical History:  Procedure Laterality Date  . BACK SURGERY     2012  . CHOLECYSTECTOMY    . NECK SURGERY     1980s/90s  . REPLACEMENT TOTAL KNEE     lt 1999 rt 2001  . SHOULDER SURGERY     x5 1980s  . WRIST SURGERY     2005    Family History  Problem Relation Age of Onset  . Alzheimer's disease Mother   . Cancer Father     lung  . Diabetes Sister     Social History   Social History  . Marital status: Married    Spouse name: N/A  . Number of children: N/A  . Years of education: N/A   Occupational History  . Not on file.   Social History Main Topics  . Smoking status: Former Smoker    Packs/day: 0.50    Years: 10.00    Types: Cigarettes  . Smokeless tobacco: Never Used  . Alcohol use No  . Drug use: No  . Sexual activity: Not on file   Other Topics Concern  . Not on file   Social History Narrative  . No narrative on file     Current Outpatient Prescriptions:  .  amLODipine (NORVASC) 5 MG tablet, Take 1/2 tablet each morning for BP., Disp: 90 tablet, Rfl: 3 .  atorvastatin (LIPITOR) 10 MG tablet, Take 1 tablet (10 mg total) by mouth at bedtime., Disp: 30 tablet, Rfl: 12 .  Blood Glucose Monitoring Suppl (ONE TOUCH ULTRA SYSTEM KIT) W/DEVICE KIT, 1 kit by Does not apply route once., Disp: 1  each, Rfl: 0 .  glucose blood test strip, Apply 1 strip to machine to check blood sugar twice daily. Dx.E11.9, Disp: 100 each, Rfl: 12 .  Insulin Glargine (TOUJEO SOLOSTAR) 300 UNIT/ML SOPN, Inject 46 Units into the skin daily after supper., Disp: 15 pen, Rfl: 3 .  Lancets (ONETOUCH ULTRASOFT) lancets, Insert lancet to prick finger once daily. Dx. E11.9, Disp: 100 each, Rfl: 12 .  lisinopril (PRINIVIL,ZESTRIL) 40 MG tablet, TAKE ONE TABLET BY MOUTH ONCE DAILY RESTART, Disp: 90 tablet, Rfl: 3  Allergies  Allergen Reactions  . Oxycodone Itching     Review of Systems  Constitutional: Positive for malaise/fatigue. Negative for chills, fever and weight loss.  HENT: Negative for hearing loss.   Eyes: Negative for blurred vision and double vision.  Respiratory: Negative for cough, shortness of breath and wheezing.   Cardiovascular: Negative for chest pain, palpitations and leg swelling.  Gastrointestinal: Positive for diarrhea. Negative for abdominal pain, blood in stool, heartburn and melena.  Genitourinary: Negative for dysuria, frequency and  urgency.  Musculoskeletal: Negative for myalgias.  Skin: Negative for rash.  Neurological: Negative for dizziness, tremors, weakness and headaches.      Objective  Vitals:   09/14/16 0811 09/14/16 0853  BP: (!) 141/60 125/60  Pulse: 67   Resp: 16   Temp: 98.3 F (36.8 C)   TempSrc: Oral   Weight: 107.5 kg (237 lb)   Height: 6' 3"  (1.905 m)     Physical Exam  Constitutional: He is oriented to person, place, and time and well-developed, well-nourished, and in no distress. No distress.  HENT:  Head: Normocephalic and atraumatic.  Eyes: Conjunctivae and EOM are normal. Pupils are equal, round, and reactive to light. No scleral icterus.  Neck: Normal range of motion. Neck supple. Carotid bruit is not present. No thyromegaly present.  Cardiovascular: Normal rate, regular rhythm and normal heart sounds.  Exam reveals no gallop and no friction  rub.   No murmur heard. Pulmonary/Chest: Effort normal and breath sounds normal. No respiratory distress. He has no wheezes. He has no rales.  Abdominal: Soft. Bowel sounds are normal. He exhibits no distension, no abdominal bruit and no mass. There is no tenderness.  Musculoskeletal: He exhibits no edema.  Lymphadenopathy:    He has no cervical adenopathy.  Neurological: He is alert and oriented to person, place, and time.  Vitals reviewed.      Recent Results (from the past 2160 hour(s))  POCT HgB A1C     Status: Normal   Collection Time: 09/14/16  8:46 AM  Result Value Ref Range   Hemoglobin A1C 8.0%      Assessment & Plan  Problem List Items Addressed This Visit      Cardiovascular and Mediastinum   Hypertension     Digestive   Chronic diarrhea   Relevant Orders   Ambulatory referral to Gastroenterology   Cdiff NAA+O+P+Stool Culture     Endocrine   Diabetes ([place])   Relevant Medications   Insulin Glargine (TOUJEO SOLOSTAR) 300 UNIT/ML SOPN    Other Visit Diagnoses    Diabetes mellitus without complication ([place])    -  Primary   Relevant Medications   Insulin Glargine (TOUJEO SOLOSTAR) 300 UNIT/ML SOPN   Other Relevant Orders   POCT HgB A1C (Completed)      Meds ordered this encounter  Medications  . Insulin Glargine (TOUJEO SOLOSTAR) 300 UNIT/ML SOPN    Sig: Inject 46 Units into the skin daily after supper.    Dispense:  15 pen    Refill:  3   1. Diabetes mellitus without complication ([place])  - POCT HgB A1C-8.0  2. 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  Dispense: 15 pen; Refill: 3  3. Essential hypertension Cont meds  4. Chronic diarrhea  - Ambulatory referral to Gastroenterology - Cdiff NAA+O+P+Stool Culture

## 2016-09-14 NOTE — Patient Instructions (Signed)
Patient had flu shot at pharmacy 2 weeks ago.

## 2016-09-14 NOTE — Addendum Note (Signed)
Addended by: Devona Konig on: 09/14/2016 09:12 AM   Modules accepted: Orders

## 2016-09-15 ENCOUNTER — Other Ambulatory Visit: Payer: Self-pay | Admitting: Family Medicine

## 2016-09-16 LAB — OVA AND PARASITE EXAMINATION: OP: NONE SEEN

## 2016-09-20 ENCOUNTER — Telehealth: Payer: Self-pay | Admitting: Family Medicine

## 2016-09-20 LAB — CLOSTRIDIUM DIFFICILE CULTURE-FECAL

## 2016-09-20 NOTE — Telephone Encounter (Signed)
Please call Roselyn Reef with [facility] [phone]

## 2016-09-20 NOTE — Telephone Encounter (Signed)
Will await C.diff results and decide on treatment.  He can start Pepto Bismol, 2 tablets 3 times a day as long as he has diarrhea.

## 2016-09-20 NOTE — Telephone Encounter (Signed)
Patient has positive Shiga-toxin 1 and 2.

## 2016-09-21 NOTE — Telephone Encounter (Signed)
Patient aware.[place] 

## 2016-09-22 ENCOUNTER — Other Ambulatory Visit: Payer: Self-pay | Admitting: Family Medicine

## 2016-09-22 ENCOUNTER — Other Ambulatory Visit: Payer: Self-pay | Admitting: *Deleted

## 2016-09-22 DIAGNOSIS — A499 Bacterial infection, unspecified: Secondary | ICD-10-CM

## 2016-09-22 MED ORDER — CIPROFLOXACIN HCL 500 MG PO TABS: 500.0000 mg | ORAL_TABLET | Freq: Two times a day (BID) | ORAL | 0 refills | Status: DC

## 2016-09-22 NOTE — Telephone Encounter (Signed)
Spoke with patient wife and let her know patient does not have c-diff. Patient positive for shiga toxin 1 and 2. Patient is to start Cipro 500mg  1 tablet twice a day for next 10 days. Spouse verbalized understanding.

## 2016-10-05 LAB — STOOL CULTURE: ORGANISM ID, BACTERIA: POSITIVE

## 2016-11-09 ENCOUNTER — Other Ambulatory Visit
Admission: RE | Admit: 2016-11-09 | Discharge: 2016-11-09 | Disposition: A | Discharge status: Home / Self Care | Payer: Medicare Other | Source: Ambulatory Visit | Attending: Student | Admitting: Student

## 2016-11-09 DIAGNOSIS — K529 Noninfective gastroenteritis and colitis, unspecified: Secondary | ICD-10-CM | POA: Diagnosis present

## 2016-11-09 LAB — GASTROINTESTINAL PANEL BY PCR, STOOL (REPLACES STOOL CULTURE)
ASTROVIRUS: NOT DETECTED
Adenovirus F40/41: NOT DETECTED
Campylobacter species: NOT DETECTED
Cryptosporidium: NOT DETECTED
Cyclospora cayetanensis: NOT DETECTED
ENTEROAGGREGATIVE E COLI (EAEC): NOT DETECTED
ENTEROPATHOGENIC E COLI (EPEC): NOT DETECTED
ENTEROTOXIGENIC E COLI (ETEC): NOT DETECTED
Entamoeba histolytica: NOT DETECTED
GIARDIA LAMBLIA: NOT DETECTED
NOROVIRUS GI/GII: NOT DETECTED
Plesimonas shigelloides: NOT DETECTED
Rotavirus A: NOT DETECTED
Salmonella species: NOT DETECTED
Sapovirus (I, II, IV, and V): NOT DETECTED
Shiga like toxin producing E coli (STEC): NOT DETECTED
Shigella/Enteroinvasive E coli (EIEC): NOT DETECTED
VIBRIO CHOLERAE: NOT DETECTED
Vibrio species: NOT DETECTED
Yersinia enterocolitica: NOT DETECTED

## 2016-11-12 LAB — PANCREATIC ELASTASE, FECAL: Pancreatic Elastase-1, Stool: 395 ug Elast./g (ref >200)

## 2016-12-14 ENCOUNTER — Encounter: Payer: Self-pay | Admitting: Family Medicine

## 2016-12-14 ENCOUNTER — Ambulatory Visit (INDEPENDENT_AMBULATORY_CARE_PROVIDER_SITE_OTHER): Payer: Medicare Other | Admitting: Family Medicine

## 2016-12-14 VITALS — BP 180/80 | HR 68 | Temp 97.7°F | Resp 16 | Ht 75.0 in | Wt 241.0 lb

## 2016-12-14 DIAGNOSIS — K529 Noninfective gastroenteritis and colitis, unspecified: Secondary | ICD-10-CM

## 2016-12-14 DIAGNOSIS — L608 Other nail disorders: Secondary | ICD-10-CM

## 2016-12-14 DIAGNOSIS — E119 Type 2 diabetes mellitus without complications: Secondary | ICD-10-CM

## 2016-12-14 DIAGNOSIS — L609 Nail disorder, unspecified: Secondary | ICD-10-CM | POA: Diagnosis not present

## 2016-12-14 DIAGNOSIS — I10 Essential (primary) hypertension: Secondary | ICD-10-CM | POA: Diagnosis not present

## 2016-12-14 DIAGNOSIS — E0800 Diabetes mellitus due to underlying condition with hyperosmolarity without nonketotic hyperglycemic-hyperosmolar coma (NKHHC): Secondary | ICD-10-CM | POA: Diagnosis not present

## 2016-12-14 DIAGNOSIS — E785 Hyperlipidemia, unspecified: Secondary | ICD-10-CM

## 2016-12-14 LAB — POCT GLYCOSYLATED HEMOGLOBIN (HGB A1C): Hemoglobin A1C: 9.6

## 2016-12-14 MED ORDER — AMLODIPINE BESYLATE 5 MG PO TABS: ORAL_TABLET | ORAL | 3 refills | Status: DC

## 2016-12-14 MED ORDER — LIRAGLUTIDE 18 MG/3ML ~~LOC~~ SOPN: 1.8000 mg | PEN_INJECTOR | Freq: Every morning | SUBCUTANEOUS | 12 refills | Status: DC

## 2016-12-14 MED ORDER — ATORVASTATIN CALCIUM 10 MG PO TABS: 10.0000 mg | ORAL_TABLET | Freq: Every day | ORAL | 3 refills | Status: DC

## 2016-12-14 MED ORDER — LISINOPRIL 40 MG PO TABS: 40.0000 mg | ORAL_TABLET | Freq: Every day | ORAL | 3 refills | Status: DC

## 2016-12-14 NOTE — Progress Notes (Signed)
Name: Jay Brown   MRN: [id number]    DOB: 1940-11-30   Date:12/14/2016       Progress Note  Subjective  Chief Complaint  Chief Complaint  Patient presents with  . fingernail  . Hypertension  . Diabetes    HPI Here c/o raw fingernail bed x 1 year.  Has seen 1 Derm and told "nothing was wrong, but stays raw and irritated.  Fingernail is absent. He has been off HBP and chol meds for 3-4 months.   Hew takes Toujeo (46 units) ans BSs run from 100-140 fasting.  No problem-specific Assessment & Plan notes found for this encounter.   Past Medical History:  Diagnosis Date  . Diabetes [facility])     Past Surgical History:  Procedure Laterality Date  . BACK SURGERY     2012  . CHOLECYSTECTOMY    . NECK SURGERY     1980s/90s  . REPLACEMENT TOTAL KNEE     lt 1999 rt 2001  . SHOULDER SURGERY     x5 1980s  . WRIST SURGERY     2005    Family History  Problem Relation Age of Onset  . Alzheimer's disease Mother   . Cancer Father     lung  . Diabetes Sister     Social History   Social History  . Marital status: Married    Spouse name: N/A  . Number of children: N/A  . Years of education: N/A   Occupational History  . Not on file.   Social History Main Topics  . Smoking status: Former Smoker    Packs/day: 0.50    Years: 10.00    Types: Cigarettes  . Smokeless tobacco: Never Used  . Alcohol use No  . Drug use: No  . Sexual activity: Not on file   Other Topics Concern  . Not on file   Social History Narrative  . No narrative on file     Current Outpatient Prescriptions:  .  Blood Glucose Monitoring Suppl (ONE TOUCH ULTRA SYSTEM KIT) W/DEVICE KIT, 1 kit by Does not apply route once., Disp: 1 each, Rfl: 0 .  colestipol (COLESTID) 1 g tablet, Take 1 tablet by mouth 2 (two) times daily., Disp: , Rfl:  .  glucose blood test strip, Apply 1 strip to machine to check blood sugar twice daily. Dx.E11.9, Disp: 100 each, Rfl: 12 .  Insulin Glargine (TOUJEO  SOLOSTAR) 300 UNIT/ML SOPN, Inject 46 Units into the skin daily after supper., Disp: 15 pen, Rfl: 3 .  Lancets (ONETOUCH ULTRASOFT) lancets, Insert lancet to prick finger once daily. Dx. E11.9, Disp: 100 each, Rfl: 12 .  amLODipine (NORVASC) 5 MG tablet, Take 1/2 tablet each morning for BP., Disp: 90 tablet, Rfl: 3 .  atorvastatin (LIPITOR) 10 MG tablet, Take 1 tablet (10 mg total) by mouth at bedtime., Disp: 90 tablet, Rfl: 3 .  liraglutide 18 MG/3ML SOPN, Inject 0.3 mLs (1.8 mg total) into the skin every morning., Disp: 3 pen, Rfl: 12 .  lisinopril (PRINIVIL,ZESTRIL) 40 MG tablet, Take 1 tablet (40 mg total) by mouth daily., Disp: 90 tablet, Rfl: 3  Allergies  Allergen Reactions  . Oxycodone Itching     Review of Systems  Constitutional: Negative for chills, fever, malaise/fatigue and weight loss.  HENT: Negative for hearing loss and tinnitus.   Eyes: Negative for blurred vision and double vision.  Respiratory: Negative for cough, shortness of breath and wheezing.   Cardiovascular: Negative for chest pain, palpitations and  leg swelling.  Gastrointestinal: Positive for diarrhea. Negative for abdominal pain, blood in stool and heartburn.  Genitourinary: Negative for dysuria, frequency and urgency.  Musculoskeletal: Negative for joint pain and myalgias.  Skin: Negative for itching and rash.       Irritated fingernail base L 3rd finger  Neurological: Negative for dizziness, tingling, tremors, weakness and headaches.      Objective  Vitals:   12/14/16 0813 12/14/16 0845  BP: (!) 200/85 (!) 180/80  Pulse: 68   Resp: 16   Temp: 97.7 F (36.5 C)   TempSrc: Oral   Weight: 241 lb (109.3 kg)   Height: 6' 3"  (1.905 m)     Physical Exam  Constitutional: He is oriented to person, place, and time and well-developed, well-nourished, and in no distress. No distress.  HENT:  Head: Normocephalic and atraumatic.  Eyes: Conjunctivae and EOM are normal. Pupils are equal, round, and  reactive to light. No scleral icterus.  Neck: Normal range of motion. Neck supple. No thyromegaly present.  Cardiovascular: Normal rate, regular rhythm and normal heart sounds.  Exam reveals no gallop and no friction rub.   No murmur heard. Pulmonary/Chest: Effort normal and breath sounds normal. No respiratory distress. He has no wheezes. He has no rales.  Abdominal: Soft. Bowel sounds are normal. He exhibits no distension and no mass. There is no tenderness.  Musculoskeletal: He exhibits edema.  Lymphadenopathy:    He has no cervical adenopathy.  Neurological: He is alert and oriented to person, place, and time.  Skin:  L 3rd finger with nail absent and rawness at nail border.  No signs of infection.  Vitals reviewed.      Recent Results (from the past 2160 hour(s))  Stool culture     Status: None   Collection Time: 09/15/16  9:04 AM  Result Value Ref Range   Organism ID, Bacteria Positive for Shiga toxin 1     Comment: ESCHERICHIA COLI NOT O157 -STX1 GENE DETECTED BY PCR Results by [facility] in [place], [place] to,Read Back By and Verified With: SONYA C. AT 1:41PM ON 09/20/16 HAJAM Report Faxed by Request FAXED TO BONNIE COLLINS 09/20/16 PICKD   Ova and parasite examination     Status: None   Collection Time: 09/15/16  9:04 AM  Result Value Ref Range   OP No Ova or Parasites Seen    Clostridium difficile culture-fecal     Status: None   Collection Time: 09/15/16  9:04 AM  Result Value Ref Range   Result-CDIFCU REPORT     Comment: Clostridium difficile Culture w/Reflex to Toxin Clostridium difficile Culture SOURCE : STOOL  Result/Comment: No Clostridium difficile isolated. C diff Tox B QL PCR C diff Tox B QL PCR             Not indicated   Gastrointestinal Panel by PCR , Stool     Status: None   Collection Time: 11/09/16  6:00 AM  Result Value Ref Range   Campylobacter species NOT DETECTED NOT DETECTED    Plesimonas shigelloides NOT DETECTED NOT DETECTED   Salmonella species NOT DETECTED NOT DETECTED   Yersinia enterocolitica NOT DETECTED NOT DETECTED   Vibrio species NOT DETECTED NOT DETECTED   Vibrio cholerae NOT DETECTED NOT DETECTED   Enteroaggregative E coli (EAEC) NOT DETECTED NOT DETECTED   Enteropathogenic E coli (EPEC) NOT DETECTED NOT DETECTED   Enterotoxigenic E coli (ETEC) NOT DETECTED NOT DETECTED   Shiga like toxin  producing E coli (STEC) NOT DETECTED NOT DETECTED   Shigella/Enteroinvasive E coli (EIEC) NOT DETECTED NOT DETECTED   Cryptosporidium NOT DETECTED NOT DETECTED   Cyclospora cayetanensis NOT DETECTED NOT DETECTED   Entamoeba histolytica NOT DETECTED NOT DETECTED   Giardia lamblia NOT DETECTED NOT DETECTED   Adenovirus F40/41 NOT DETECTED NOT DETECTED   Astrovirus NOT DETECTED NOT DETECTED   Norovirus GI/GII NOT DETECTED NOT DETECTED   Rotavirus A NOT DETECTED NOT DETECTED   Sapovirus (I, II, IV, and V) NOT DETECTED NOT DETECTED  Pancreatic elastase, fecal     Status: None   Collection Time: 11/09/16  6:00 AM  Result Value Ref Range   Pancreatic Elastase-1, Stool 395 >200 ug Elast./g    Comment: (NOTE)       Severe Pancreatic Insufficiency:          <100       Moderate Pancreatic Insufficiency:   100 - 200       Normal:                                   >200 Performed At: [facility] [place], [place] [id number] Lindon Romp MD LY:[id number]   POCT HgB A1C     Status: Abnormal   Collection Time: 12/14/16  8:45 AM  Result Value Ref Range   Hemoglobin A1C 9.6%      Assessment & Plan  Problem List Items Addressed This Visit      Cardiovascular and Mediastinum   Hypertension   Relevant Medications   colestipol (COLESTID) 1 g tablet   amLODipine (NORVASC) 5 MG tablet   atorvastatin (LIPITOR) 10 MG tablet   lisinopril (PRINIVIL,ZESTRIL) 40 MG tablet     Digestive   Chronic diarrhea     Endocrine   Diabetes (HCC)    Relevant Medications   atorvastatin (LIPITOR) 10 MG tablet   lisinopril (PRINIVIL,ZESTRIL) 40 MG tablet   liraglutide 18 MG/3ML SOPN     Musculoskeletal and Integument   Deformity of nail bed   Relevant Orders   Ambulatory referral to Dermatology    Other Visit Diagnoses    Diabetes mellitus without complication ([place])    -  Primary   Relevant Medications   atorvastatin (LIPITOR) 10 MG tablet   lisinopril (PRINIVIL,ZESTRIL) 40 MG tablet   liraglutide 18 MG/3ML SOPN   Other Relevant Orders   POCT HgB A1C (Completed)   Hyperlipidemia, unspecified hyperlipidemia type       Relevant Medications   colestipol (COLESTID) 1 g tablet   amLODipine (NORVASC) 5 MG tablet   atorvastatin (LIPITOR) 10 MG tablet   lisinopril (PRINIVIL,ZESTRIL) 40 MG tablet      Meds ordered this encounter  Medications  . colestipol (COLESTID) 1 g tablet    Sig: Take 1 tablet by mouth 2 (two) times daily.  Marland Kitchen amLODipine (NORVASC) 5 MG tablet    Sig: Take 1/2 tablet each morning for BP.    Dispense:  90 tablet    Refill:  3  . atorvastatin (LIPITOR) 10 MG tablet    Sig: Take 1 tablet (10 mg total) by mouth at bedtime.    Dispense:  90 tablet    Refill:  3  . lisinopril (PRINIVIL,ZESTRIL) 40 MG tablet    Sig: Take 1 tablet (40 mg total) by mouth daily.    Dispense:  90 tablet    Refill:  3  .  liraglutide 18 MG/3ML SOPN    Sig: Inject 0.3 mLs (1.8 mg total) into the skin every morning.    Dispense:  3 pen    Refill:  12   1. Diabetes mellitus without complication (HCC)  - POCT HgB A1C-9.6 - liraglutide 18 MG/3ML SOPN; Inject 0.3 mLs (1.8 mg total) into the skin every morning.  Dispense: 3 pen; Refill: 12 Cont Tloujeo, 46 units hs. 2. Essential hypertension  - amLODipine (NORVASC) 5 MG tablet; Take 1/2 tablet each morning for BP.  Dispense: 90 tablet; Refill: 3 - lisinopril (PRINIVIL,ZESTRIL) 40 MG tablet; Take 1 tablet (40 mg total) by mouth daily.  Dispense: 90 tablet; Refill: 3  3. Diabetes  mellitus due to underlying condition with hyperosmolarity without coma, without long-term current use of insulin ([place])   4. Chronic diarrhea Cont Colestipol  5. Deformity of nail bed  - Ambulatory referral to Dermatology  6. Hyperlipidemia, unspecified hyperlipidemia type  - atorvastatin (LIPITOR) 10 MG tablet; Take 1 tablet (10 mg total) by mouth at bedtime.  Dispense: 90 tablet; Refill: 3

## 2016-12-24 ENCOUNTER — Encounter: Payer: Self-pay | Admitting: *Deleted

## 2016-12-30 ENCOUNTER — Ambulatory Visit: Payer: Medicare Other | Admitting: Family Medicine

## 2017-01-20 ENCOUNTER — Ambulatory Visit (INDEPENDENT_AMBULATORY_CARE_PROVIDER_SITE_OTHER): Payer: Medicare Other | Admitting: Family Medicine

## 2017-01-20 ENCOUNTER — Encounter: Payer: Self-pay | Admitting: Family Medicine

## 2017-01-20 VITALS — BP 120/60 | HR 70 | Temp 97.6°F | Resp 16 | Ht 75.0 in | Wt 235.0 lb

## 2017-01-20 DIAGNOSIS — E784 Other hyperlipidemia: Secondary | ICD-10-CM

## 2017-01-20 DIAGNOSIS — E785 Hyperlipidemia, unspecified: Secondary | ICD-10-CM

## 2017-01-20 DIAGNOSIS — E1169 Type 2 diabetes mellitus with other specified complication: Secondary | ICD-10-CM | POA: Insufficient documentation

## 2017-01-20 DIAGNOSIS — K529 Noninfective gastroenteritis and colitis, unspecified: Secondary | ICD-10-CM | POA: Diagnosis not present

## 2017-01-20 DIAGNOSIS — E0800 Diabetes mellitus due to underlying condition with hyperosmolarity without nonketotic hyperglycemic-hyperosmolar coma (NKHHC): Secondary | ICD-10-CM

## 2017-01-20 DIAGNOSIS — I10 Essential (primary) hypertension: Secondary | ICD-10-CM | POA: Diagnosis not present

## 2017-01-20 DIAGNOSIS — E7849 Other hyperlipidemia: Secondary | ICD-10-CM

## 2017-01-20 DIAGNOSIS — R29898 Other symptoms and signs involving the musculoskeletal system: Secondary | ICD-10-CM | POA: Insufficient documentation

## 2017-01-20 NOTE — Progress Notes (Signed)
Name: Jay Brown   MRN: [id number]    DOB: [birth year]   Date:01/20/2017       Progress Note  Subjective  Chief Complaint  Chief Complaint  Patient presents with  . Diabetes  . Hypertension    HPI  Here for f/u of DM and HBP.  He also has IBS and is treated by Dr. Vira Agar with new medication.  BSs running 110-140 in AM.  BPs 140/75. No problem-specific Assessment & Plan notes found for this encounter.   Past Medical History:  Diagnosis Date  . Diabetes [facility])     Past Surgical History:  Procedure Laterality Date  . BACK SURGERY     2012  . CHOLECYSTECTOMY    . NECK SURGERY     1980s/90s  . REPLACEMENT TOTAL KNEE     lt 1999 rt 2001  . SHOULDER SURGERY     x5 1980s  . WRIST SURGERY     2005    Family History  Problem Relation Age of Onset  . Alzheimer's disease Mother   . Cancer Father     lung  . Diabetes Sister     Social History   Social History  . Marital status: Married    Spouse name: N/A  . Number of children: N/A  . Years of education: N/A   Occupational History  . Not on file.   Social History Main Topics  . Smoking status: Former Smoker    Packs/day: 0.50    Years: 10.00    Types: Cigarettes  . Smokeless tobacco: Never Used  . Alcohol use No  . Drug use: No  . Sexual activity: Not on file   Other Topics Concern  . Not on file   Social History Narrative  . No narrative on file     Current Outpatient Prescriptions:  .  amLODipine (NORVASC) 5 MG tablet, Take 1/2 tablet each morning for BP., Disp: 90 tablet, Rfl: 3 .  atorvastatin (LIPITOR) 10 MG tablet, Take 1 tablet (10 mg total) by mouth at bedtime., Disp: 90 tablet, Rfl: 3 .  Blood Glucose Monitoring Suppl (ONE TOUCH ULTRA SYSTEM KIT) W/DEVICE KIT, 1 kit by Does not apply route once., Disp: 1 each, Rfl: 0 .  glucose blood test strip, Apply 1 strip to machine to check blood sugar twice daily. Dx.E11.9, Disp: 100 each, Rfl: 12 .  Insulin Glargine (TOUJEO SOLOSTAR) 300 UNIT/ML  SOPN, Inject 46 Units into the skin daily after supper., Disp: 15 pen, Rfl: 3 .  Lancets (ONETOUCH ULTRASOFT) lancets, Insert lancet to prick finger once daily. Dx. E11.9, Disp: 100 each, Rfl: 12 .  liraglutide 18 MG/3ML SOPN, Inject 0.3 mLs (1.8 mg total) into the skin every morning., Disp: 3 pen, Rfl: 12 .  lisinopril (PRINIVIL,ZESTRIL) 40 MG tablet, Take 1 tablet (40 mg total) by mouth daily., Disp: 90 tablet, Rfl: 3 .  rifaximin (XIFAXAN) 550 MG TABS tablet, Take 550 mg by mouth 3 (three) times daily., Disp: , Rfl:   Allergies  Allergen Reactions  . Oxycodone Itching     Review of Systems  Constitutional: Negative for chills, fever, malaise/fatigue and weight loss.  HENT: Negative for hearing loss and tinnitus.   Eyes: Negative for blurred vision and double vision.  Respiratory: Negative for cough, shortness of breath and wheezing.   Cardiovascular: Negative for chest pain, palpitations and leg swelling.  Gastrointestinal: Positive for diarrhea. Negative for abdominal pain, blood in stool and heartburn.  Genitourinary: Negative for dysuria, frequency and  urgency.  Musculoskeletal: Negative for myalgias.  Skin: Negative for rash.  Neurological: Negative for dizziness, tingling, tremors, weakness and headaches.      Objective  Vitals:   01/20/17 0811 01/20/17 0836  BP: (!) 141/70 120/60  Pulse: 70   Resp: 16   Temp: 97.6 F (36.4 C)   TempSrc: Oral   Weight: 235 lb (106.6 kg)   Height: _0  (1.905 m)     Physical Exam  Constitutional: He is well-developed, well-nourished, and in no distress. No distress.  HENT:  Head: Normocephalic and atraumatic.  Eyes: Conjunctivae and EOM are normal. Pupils are equal, round, and reactive to light. No scleral icterus.  Neck: Normal range of motion. Neck supple. Carotid bruit is not present. No thyromegaly present.  Cardiovascular: Normal rate, regular rhythm and normal heart sounds.  Exam reveals no gallop and no friction rub.    No murmur heard. Pulmonary/Chest: Effort normal and breath sounds normal. No respiratory distress. He has no wheezes. He has no rales.  Abdominal: Soft. Bowel sounds are normal. He exhibits no distension and no mass. There is no tenderness.  Musculoskeletal: He exhibits no edema.  Lymphadenopathy:    He has no cervical adenopathy.  Neurological: He is alert.  Musclle wasting and weakness R thenar eminence.  Vitals reviewed.      Recent Results (from the past 2160 hour(s))  Gastrointestinal Panel by PCR , Stool     Status: None   Collection Time: 11/09/16  6:00 AM  Result Value Ref Range   Campylobacter species NOT DETECTED NOT DETECTED   Plesimonas shigelloides NOT DETECTED NOT DETECTED   Salmonella species NOT DETECTED NOT DETECTED   Yersinia enterocolitica NOT DETECTED NOT DETECTED   Vibrio species NOT DETECTED NOT DETECTED   Vibrio cholerae NOT DETECTED NOT DETECTED   Enteroaggregative E coli (EAEC) NOT DETECTED NOT DETECTED   Enteropathogenic E coli (EPEC) NOT DETECTED NOT DETECTED   Enterotoxigenic E coli (ETEC) NOT DETECTED NOT DETECTED   Shiga like toxin producing E coli (STEC) NOT DETECTED NOT DETECTED   Shigella/Enteroinvasive E coli (EIEC) NOT DETECTED NOT DETECTED   Cryptosporidium NOT DETECTED NOT DETECTED   Cyclospora cayetanensis NOT DETECTED NOT DETECTED   Entamoeba histolytica NOT DETECTED NOT DETECTED   Giardia lamblia NOT DETECTED NOT DETECTED   Adenovirus F40/41 NOT DETECTED NOT DETECTED   Astrovirus NOT DETECTED NOT DETECTED   Norovirus GI/GII NOT DETECTED NOT DETECTED   Rotavirus A NOT DETECTED NOT DETECTED   Sapovirus (I, II, IV, and V) NOT DETECTED NOT DETECTED  Pancreatic elastase, fecal     Status: None   Collection Time: 11/09/16  6:00 AM  Result Value Ref Range   Pancreatic Elastase-1, Stool 395 >200 ug Elast./g    Comment: (NOTE)       Severe Pancreatic Insufficiency:          <100       Moderate Pancreatic Insufficiency:   100 - 200        Normal:                                   >200 Performed At: [facility] [place], [place] [id number] Lindon Romp MD OI:[id number]   POCT HgB A1C     Status: Abnormal   Collection Time: 12/14/16  8:45 AM  Result Value Ref Range   Hemoglobin A1C 9.6%  Assessment & Plan  Problem List Items Addressed This Visit      Cardiovascular and Mediastinum   Hypertension   Relevant Orders   COMPLETE METABOLIC PANEL WITH GFR     Digestive   Chronic diarrhea   Relevant Orders   CBC with Differential     Endocrine   Diabetes ([place]) - Primary   Relevant Orders   HgB A1c     Other   Hyperlipidemia   Relevant Orders   Lipid Profile   Weakness of hand   Relevant Orders   Ambulatory referral to Neurology      Meds ordered this encounter  Medications  . rifaximin (XIFAXAN) 550 MG TABS tablet    Sig: Take 550 mg by mouth 3 (three) times daily.   1. Diabetes mellitus due to underlying condition with hyperosmolarity without coma, without long-term current use of insulin (HCC) Cont meds - HgB A1c  2. Essential hypertension Cont meds - COMPLETE METABOLIC PANEL WITH GFR  3. Chronic diarrhea Cont to see Dr. Vira Agar - CBC with Differential  4. Other hyperlipidemia Cont med - Lipid Profile  5. Weakness of hand  - Ambulatory referral to Neurology-Dr. Manuella Ghazi

## 2017-01-25 ENCOUNTER — Other Ambulatory Visit: Payer: Medicare Other

## 2017-01-25 LAB — CBC WITH DIFFERENTIAL/PLATELET
BASOS PCT: 1 %
Basophils Absolute: 79 cells/uL (ref 0–200)
EOS ABS: 158 {cells}/uL (ref 15–500)
Eosinophils Relative: 2 %
HCT: 42.4 % (ref 38.5–50.0)
Hemoglobin: 14 g/dL (ref 13.2–17.1)
LYMPHS PCT: 28 %
Lymphs Abs: 2212 cells/uL (ref 850–3900)
MCH: 28.9 pg (ref 27.0–33.0)
MCHC: 33 g/dL (ref 32.0–36.0)
MCV: 87.6 fL (ref 80.0–100.0)
MONOS PCT: 10 %
MPV: 10.1 fL (ref 7.5–12.5)
Monocytes Absolute: 790 cells/uL (ref 200–950)
Neutro Abs: 4661 cells/uL (ref 1500–7800)
Neutrophils Relative %: 59 %
Platelets: 291 10*3/uL (ref 140–400)
RBC: 4.84 MIL/uL (ref 4.20–5.80)
RDW: 13.6 % (ref 11.0–15.0)
WBC: 7.9 10*3/uL (ref 3.8–10.8)

## 2017-01-26 LAB — COMPLETE METABOLIC PANEL WITH GFR
ALBUMIN: 3.9 g/dL (ref 3.6–5.1)
ALK PHOS: 70 U/L (ref 40–115)
ALT: 13 U/L (ref 9–46)
AST: 17 U/L (ref 10–35)
BILIRUBIN TOTAL: 0.5 mg/dL (ref 0.2–1.2)
BUN: 12 mg/dL (ref 7–25)
CALCIUM: 9 mg/dL (ref 8.6–10.3)
CO2: 27 mmol/L (ref 20–31)
CREATININE: 1.08 mg/dL (ref 0.70–1.18)
Chloride: 105 mmol/L (ref 98–110)
GFR, EST AFRICAN AMERICAN: 77 mL/min (ref >=60)
GFR, Est Non African American: 67 mL/min (ref >=60)
Glucose, Bld: 110 mg/dL — ABNORMAL HIGH (ref 65–99)
Potassium: 4.2 mmol/L (ref 3.5–5.3)
Sodium: 139 mmol/L (ref 135–146)
TOTAL PROTEIN: 6.9 g/dL (ref 6.1–8.1)

## 2017-01-26 LAB — HEMOGLOBIN A1C
Hgb A1c MFr Bld: 8.3 % — ABNORMAL HIGH (ref <5.7)
Mean Plasma Glucose: 192 mg/dL

## 2017-01-26 LAB — LIPID PANEL
CHOL/HDL RATIO: 3.3 ratio (ref <5.0)
CHOLESTEROL: 103 mg/dL (ref <200)
HDL: 31 mg/dL — AB (ref >40)
LDL CALC: 56 mg/dL (ref <100)
TRIGLYCERIDES: 82 mg/dL (ref <150)
VLDL: 16 mg/dL (ref <30)

## 2017-02-09 ENCOUNTER — Ambulatory Visit
Admission: EM | Admit: 2017-02-09 | Discharge: 2017-02-09 | Disposition: A | Discharge status: Home / Self Care | Payer: Medicare Other | Attending: Family Medicine | Admitting: Family Medicine

## 2017-02-09 DIAGNOSIS — L03115 Cellulitis of right lower limb: Secondary | ICD-10-CM

## 2017-02-09 MED ORDER — CEPHALEXIN 500 MG PO CAPS: 500.0000 mg | ORAL_CAPSULE | Freq: Four times a day (QID) | ORAL | 0 refills | Status: AC

## 2017-02-09 NOTE — ED Provider Notes (Signed)
CSN: [id number]     Arrival date & time 02/09/17  1237 History   First MD Initiated Contact with Patient 02/09/17 1350     Chief Complaint  Patient presents with  . Leg Pain   (Consider location/radiation/quality/duration/timing/severity/associated sxs/prior Treatment) 76 year old male presents with injury to his right lower leg. He was on his tractor 2 days ago when his right leg slipped and hit the metal part on the tractor which cut the front of his lower leg. He has been putting Zinc on the area but now the area is red, warm, and painful to touch. The area is also oozing slightly bloody to yellow discharge. He denies any fever, chills, headache, body aches or numbness in the area. He has a history of Diabetes, in which he takes Insulin, as well as HTN, and Hyperlipidemia. He is concerned over infection. He had a previous skin infection of his finger that put him in the hospital for 14 days due to poor healing. No distinct history of MRSA.    The history is provided by the patient.    Past Medical History:  Diagnosis Date  . Diabetes [facility])    Past Surgical History:  Procedure Laterality Date  . BACK SURGERY     2012  . CHOLECYSTECTOMY    . NECK SURGERY     1980s/90s  . REPLACEMENT TOTAL KNEE     lt 1999 rt 2001  . SHOULDER SURGERY     x5 1980s  . WRIST SURGERY     2005   Family History  Problem Relation Age of Onset  . Alzheimer's disease Mother   . Cancer Father     lung  . Diabetes Sister    Social History  Substance Use Topics  . Smoking status: Former Smoker    Packs/day: 0.50    Years: 10.00    Types: Cigarettes  . Smokeless tobacco: Never Used  . Alcohol use No    Review of Systems  Constitutional: Negative for chills, fatigue and fever.  Respiratory: Negative for cough, chest tightness, shortness of breath and wheezing.   Cardiovascular: Negative for chest pain.  Gastrointestinal: Negative for abdominal pain, nausea and vomiting.  Musculoskeletal:  Positive for myalgias.  Skin: Positive for color change and wound. Negative for rash.  Neurological: Negative for weakness, numbness and headaches.  Hematological: Negative for adenopathy.    Allergies  Oxycodone  Home Medications   Prior to Admission medications   Medication Sig Start Date End Date Taking? Authorizing Provider  amLODipine (NORVASC) 5 MG tablet Take 1/2 tablet each morning for BP. 12/14/16  Yes Arlis Porta., MD  atorvastatin (LIPITOR) 10 MG tablet Take 1 tablet (10 mg total) by mouth at bedtime. 12/14/16  Yes Arlis Porta., MD  Blood Glucose Monitoring Suppl (ONE TOUCH ULTRA SYSTEM KIT) W/DEVICE KIT 1 kit by Does not apply route once. 07/14/15  Yes Arlis Porta., MD  glucose blood test strip Apply 1 strip to machine to check blood sugar twice daily. Dx.E11.9 01/19/16  Yes Arlis Porta., MD  Insulin Glargine (TOUJEO SOLOSTAR) 300 UNIT/ML SOPN Inject 46 Units into the skin daily after supper. 09/14/16  Yes Arlis Porta., MD  Lancets [facility] ULTRASOFT) lancets Insert lancet to prick finger once daily. Dx. E11.9 07/15/15  Yes Arlis Porta., MD  liraglutide 18 MG/3ML SOPN Inject 0.3 mLs (1.8 mg total) into the skin every morning. 12/14/16  Yes Arlis Porta.,  MD  lisinopril (PRINIVIL,ZESTRIL) 40 MG tablet Take 1 tablet (40 mg total) by mouth daily. 12/14/16  Yes Arlis Porta., MD  cephALEXin (KEFLEX) 500 MG capsule Take 1 capsule (500 mg total) by mouth 4 (four) times daily. 02/09/17 02/16/17  Katy Apo, NP   Meds Ordered and Administered this Visit  Medications - No data to display  BP (!) 143/64 (BP Location: Left Arm)   Pulse 72   Temp 98.1 F (36.7 C) (Oral)   Resp 18   Ht 6' 3"  (1.905 m)   Wt 235 lb (106.6 kg)   SpO2 99%   BMI 29.37 kg/m  No data found.   Physical Exam  Constitutional: He is oriented to person, place, and time. He appears well-developed and well-nourished. No distress.  HENT:  Head:  Normocephalic and atraumatic.  Neck: Normal range of motion.  Cardiovascular: Normal rate and regular rhythm.   Pulmonary/Chest: Effort normal and breath sounds normal. No respiratory distress.  Musculoskeletal: He exhibits tenderness.       Right lower leg: He exhibits tenderness, swelling, edema and laceration. He exhibits no deformity.       Legs: Small abrasion and open wound present on right mid-tibia about 1cm by 1cm. Area is open with slight bloody clear to yellow discharge. Tender around area and warmth spreading from knee to ankle. Redness surrounding area about 3cm by 2cm. Good pulses distally. No neuro deficits noted.    Neurological: He is alert and oriented to person, place, and time. He has normal strength. No sensory deficit.  Skin: Skin is warm. Capillary refill takes less than 2 seconds. Abrasion and laceration noted. There is erythema.  Psychiatric: He has a normal mood and affect. His behavior is normal. Judgment and thought content normal.    Urgent Care Course     Procedures (including critical care time)  Labs Review Labs Reviewed - No data to display  Imaging Review No results found.   Visual Acuity Review  Right Eye Distance:   Left Eye Distance:   Bilateral Distance:    Right Eye Near:   Left Eye Near:    Bilateral Near:         MDM   1. Cellulitis of right lower extremity    Discussed with patient that he has mild cellulitis of his right lower leg. Recommend start Keflex 562m 4 times a day as directed. Recommend clean area with soap and water and apply antibiotic ointment and cover with bandage. Try to elevate leg as much as possible. Continue to monitor area. If redness, swelling, pain and warmth do not improve within 2 days, follow-up here or with your primary care provider. If symptoms worsen, go to ER.     AKaty Apo NP 02/09/17 [phone]

## 2017-02-09 NOTE — Discharge Instructions (Signed)
Recommend take Keflex 500mg  4 times a day as directed. Wash area with soap and water and keep covered. Follow-up with your primary care provider in 2 to 3 days if not improving or go to ER if pain/infection worsens.

## 2017-02-09 NOTE — ED Triage Notes (Signed)
Patient states that his foot slipped off the tractor and his right leg slapped up again the metal part of the tractor. Patient states that this occurred on Monday and has been worsening. Patient states that when he starts to walk it hurts but eventually loosens up.

## 2017-02-20 DIAGNOSIS — G5601 Carpal tunnel syndrome, right upper limb: Secondary | ICD-10-CM | POA: Insufficient documentation

## 2017-04-04 ENCOUNTER — Encounter
Admission: RE | Admit: 2017-04-04 | Discharge: 2017-04-04 | Disposition: A | Discharge status: Home / Self Care | Payer: Medicare Other | Source: Ambulatory Visit | Attending: Unknown Physician Specialty | Admitting: Unknown Physician Specialty

## 2017-04-04 DIAGNOSIS — Z01810 Encounter for preprocedural cardiovascular examination: Secondary | ICD-10-CM | POA: Diagnosis not present

## 2017-04-04 DIAGNOSIS — I440 Atrioventricular block, first degree: Secondary | ICD-10-CM | POA: Diagnosis not present

## 2017-04-04 DIAGNOSIS — I10 Essential (primary) hypertension: Secondary | ICD-10-CM | POA: Insufficient documentation

## 2017-04-04 DIAGNOSIS — R001 Bradycardia, unspecified: Secondary | ICD-10-CM | POA: Insufficient documentation

## 2017-04-04 HISTORY — DX: Age-related osteoporosis without current pathological fracture: M81.0

## 2017-04-04 HISTORY — DX: Benign neoplasm, unspecified site: D36.9

## 2017-04-04 HISTORY — DX: Essential (primary) hypertension: I10

## 2017-04-04 HISTORY — DX: Nausea with vomiting, unspecified: Z98.890

## 2017-04-04 HISTORY — DX: Osteomyelitis, unspecified: M86.9

## 2017-04-04 HISTORY — DX: Personal history of urinary calculi: Z87.442

## 2017-04-04 HISTORY — DX: Other cervical disc degeneration, unspecified cervical region: M50.30

## 2017-04-04 HISTORY — DX: Hyperlipidemia, unspecified: E78.5

## 2017-04-04 HISTORY — DX: Unspecified malignant neoplasm of skin, unspecified: C44.90

## 2017-04-04 HISTORY — DX: Nausea with vomiting, unspecified: R11.2

## 2017-04-04 NOTE — Pre-Procedure Instructions (Signed)
EKG CALLED TO DR Marcello Moores AND [place]

## 2017-04-04 NOTE — Patient Instructions (Signed)
  Your procedure is scheduled on: Wednesday, Apr 13, 2017 Report to Same Day Surgery 2nd floor medical mall ([facility] Entrance-take elevator on left to 2nd floor.  Check in with surgery information desk.) To find out your arrival time please call [phone] between 1PM - 3PM on Tuesday, Apr 12, 2017  Remember: Instructions that are not followed completely may result in serious medical risk, up to and including death, or upon the discretion of your surgeon and anesthesiologist your surgery may need to be rescheduled.    _x___ 1. Do not eat food or drink liquids after midnight. No gum chewing or hard candies.      __x__ 2. No Alcohol for 24 hours before or after surgery.   __x__3. No Smoking for 24 prior to surgery.   ____  4. Bring all medications with you on the day of surgery if instructed.    __x__ 5. Notify your doctor if there is any change in your medical condition     (cold, fever, infections).     Do not wear jewelry, make-up, hairpins, clips or nail polish.  Do not wear lotions, powders, or perfumes. You may wear deodorant.  Do not shave 48 hours prior to surgery. Men may shave face and neck.  Do not bring valuables to the hospital.    [facility] is not responsible for any belongings or valuables.               Contacts, dentures or bridgework may not be worn into surgery.    Patients discharged the day of surgery will not be allowed to drive home.  You will need someone to drive you home and stay with you the night of your procedure.    Please read over the following fact sheets that you were given:   [facility] Preparing for Surgery and or MRSA Information   _x___ Take these medications the morning of surgery with a SIP of water:   1. AMLODIPINE  2. LISINOPRIL    _x___ Use CHG Soap or sage wipes as directed on instruction sheet    __X__ Take 1/2 of usual insulin dose the night before surgery and none on the morning surgery. (TOUJEO - TAKE 24 UNITS THE NIGHT  BEFORE SURGERY)  __X__Stop Anti-inflammatories such as Advil, Aleve, Ibuprofen, Motrin, Naproxen, Naprosyn, Goodies powders or aspirin products. OK to take Tylenol.   _x___ Stop supplements until after surgery.

## 2017-04-13 ENCOUNTER — Encounter
Admission: RE | Disposition: A | Discharge status: Home / Self Care | Payer: Self-pay | Source: Ambulatory Visit | Attending: Unknown Physician Specialty

## 2017-04-13 ENCOUNTER — Ambulatory Visit
Admission: RE | Admit: 2017-04-13 | Discharge: 2017-04-13 | Disposition: A | Discharge status: Home / Self Care | Payer: Medicare Other | Source: Ambulatory Visit | Attending: Unknown Physician Specialty | Admitting: Unknown Physician Specialty

## 2017-04-13 ENCOUNTER — Encounter: Payer: Self-pay | Admitting: *Deleted

## 2017-04-13 ENCOUNTER — Ambulatory Visit: Payer: Medicare Other | Admitting: Anesthesiology

## 2017-04-13 DIAGNOSIS — Z89429 Acquired absence of other toe(s), unspecified side: Secondary | ICD-10-CM | POA: Diagnosis not present

## 2017-04-13 DIAGNOSIS — Z791 Long term (current) use of non-steroidal anti-inflammatories (NSAID): Secondary | ICD-10-CM | POA: Insufficient documentation

## 2017-04-13 DIAGNOSIS — Z86010 Personal history of colonic polyps: Secondary | ICD-10-CM | POA: Insufficient documentation

## 2017-04-13 DIAGNOSIS — Z9889 Other specified postprocedural states: Secondary | ICD-10-CM | POA: Insufficient documentation

## 2017-04-13 DIAGNOSIS — Z9842 Cataract extraction status, left eye: Secondary | ICD-10-CM | POA: Diagnosis not present

## 2017-04-13 DIAGNOSIS — Z96653 Presence of artificial knee joint, bilateral: Secondary | ICD-10-CM | POA: Insufficient documentation

## 2017-04-13 DIAGNOSIS — E785 Hyperlipidemia, unspecified: Secondary | ICD-10-CM | POA: Diagnosis not present

## 2017-04-13 DIAGNOSIS — Z794 Long term (current) use of insulin: Secondary | ICD-10-CM | POA: Diagnosis not present

## 2017-04-13 DIAGNOSIS — E119 Type 2 diabetes mellitus without complications: Secondary | ICD-10-CM | POA: Insufficient documentation

## 2017-04-13 DIAGNOSIS — Z87891 Personal history of nicotine dependence: Secondary | ICD-10-CM | POA: Diagnosis not present

## 2017-04-13 DIAGNOSIS — M810 Age-related osteoporosis without current pathological fracture: Secondary | ICD-10-CM | POA: Diagnosis not present

## 2017-04-13 DIAGNOSIS — Z801 Family history of malignant neoplasm of trachea, bronchus and lung: Secondary | ICD-10-CM | POA: Diagnosis not present

## 2017-04-13 DIAGNOSIS — Z9049 Acquired absence of other specified parts of digestive tract: Secondary | ICD-10-CM | POA: Diagnosis not present

## 2017-04-13 DIAGNOSIS — G5601 Carpal tunnel syndrome, right upper limb: Secondary | ICD-10-CM | POA: Insufficient documentation

## 2017-04-13 DIAGNOSIS — Z800 Family history of malignant neoplasm of digestive organs: Secondary | ICD-10-CM | POA: Insufficient documentation

## 2017-04-13 DIAGNOSIS — Z85828 Personal history of other malignant neoplasm of skin: Secondary | ICD-10-CM | POA: Insufficient documentation

## 2017-04-13 DIAGNOSIS — Z79899 Other long term (current) drug therapy: Secondary | ICD-10-CM | POA: Diagnosis not present

## 2017-04-13 DIAGNOSIS — I10 Essential (primary) hypertension: Secondary | ICD-10-CM | POA: Diagnosis not present

## 2017-04-13 HISTORY — PX: CARPAL TUNNEL RELEASE: SHX101

## 2017-04-13 LAB — GLUCOSE, CAPILLARY
Glucose-Capillary: 315 mg/dL — ABNORMAL HIGH (ref 65–99)
Glucose-Capillary: 283 mg/dL — ABNORMAL HIGH (ref 65–99)
Glucose-Capillary: 261 mg/dL — ABNORMAL HIGH (ref 65–99)

## 2017-04-13 SURGERY — CARPAL TUNNEL RELEASE
Anesthesia: General | Site: Hand | Laterality: Right | Wound class: Clean

## 2017-04-13 MED ORDER — FAMOTIDINE 20 MG PO TABS
20.0000 mg | ORAL_TABLET | Freq: Once | ORAL | Status: AC
  Administered 2017-04-13: 20 mg via ORAL

## 2017-04-13 MED ORDER — DEXAMETHASONE SODIUM PHOSPHATE 10 MG/ML IJ SOLN
INTRAMUSCULAR | Status: DC | PRN
  Administered 2017-04-13: 5 mg via INTRAVENOUS

## 2017-04-13 MED ORDER — FENTANYL CITRATE (PF) 100 MCG/2ML IJ SOLN
INTRAMUSCULAR | Status: DC | PRN
  Administered 2017-04-13 (×2): 50 ug via INTRAVENOUS

## 2017-04-13 MED ORDER — PROMETHAZINE HCL 25 MG/ML IJ SOLN: 6.2500 mg | INTRAMUSCULAR | Status: DC | PRN

## 2017-04-13 MED ORDER — DEXAMETHASONE SODIUM PHOSPHATE 10 MG/ML IJ SOLN
INTRAMUSCULAR | Status: AC
  Filled 2017-04-13: qty 1

## 2017-04-13 MED ORDER — LIDOCAINE HCL (PF) 2 % IJ SOLN
INTRAMUSCULAR | Status: DC | PRN
  Administered 2017-04-13: 50 mg

## 2017-04-13 MED ORDER — PROPOFOL 10 MG/ML IV BOLUS
INTRAVENOUS | Status: AC
  Filled 2017-04-13: qty 20

## 2017-04-13 MED ORDER — INSULIN ASPART 100 UNIT/ML ~~LOC~~ SOLN
SUBCUTANEOUS | Status: AC
  Filled 2017-04-13: qty 10

## 2017-04-13 MED ORDER — SCOPOLAMINE 1 MG/3DAYS TD PT72
MEDICATED_PATCH | TRANSDERMAL | Status: AC
  Filled 2017-04-13: qty 1

## 2017-04-13 MED ORDER — FAMOTIDINE 20 MG PO TABS
ORAL_TABLET | ORAL | Status: AC
  Filled 2017-04-13: qty 1

## 2017-04-13 MED ORDER — MEPERIDINE HCL 50 MG/ML IJ SOLN: 6.2500 mg | INTRAMUSCULAR | Status: DC | PRN

## 2017-04-13 MED ORDER — ONDANSETRON HCL 4 MG/2ML IJ SOLN
INTRAMUSCULAR | Status: DC | PRN
  Administered 2017-04-13: 4 mg via INTRAVENOUS

## 2017-04-13 MED ORDER — GLYCOPYRROLATE 0.2 MG/ML IJ SOLN
INTRAMUSCULAR | Status: AC
  Filled 2017-04-13: qty 1

## 2017-04-13 MED ORDER — BUPIVACAINE HCL (PF) 0.5 % IJ SOLN
INTRAMUSCULAR | Status: AC
  Filled 2017-04-13: qty 30

## 2017-04-13 MED ORDER — FENTANYL CITRATE (PF) 100 MCG/2ML IJ SOLN: 25.0000 ug | INTRAMUSCULAR | Status: DC | PRN

## 2017-04-13 MED ORDER — PROPOFOL 10 MG/ML IV BOLUS
INTRAVENOUS | Status: DC | PRN
  Administered 2017-04-13: 150 mg via INTRAVENOUS

## 2017-04-13 MED ORDER — BUPIVACAINE HCL (PF) 0.5 % IJ SOLN
INTRAMUSCULAR | Status: DC | PRN
  Administered 2017-04-13: 5 mL

## 2017-04-13 MED ORDER — LIDOCAINE HCL (PF) 2 % IJ SOLN
INTRAMUSCULAR | Status: AC
  Filled 2017-04-13: qty 2

## 2017-04-13 MED ORDER — GLYCOPYRROLATE 0.2 MG/ML IJ SOLN
INTRAMUSCULAR | Status: DC | PRN
  Administered 2017-04-13: 0.2 mg via INTRAVENOUS

## 2017-04-13 MED ORDER — FENTANYL CITRATE (PF) 100 MCG/2ML IJ SOLN
INTRAMUSCULAR | Status: AC
  Filled 2017-04-13: qty 2

## 2017-04-13 MED ORDER — ONDANSETRON HCL 4 MG/2ML IJ SOLN
INTRAMUSCULAR | Status: AC
  Filled 2017-04-13: qty 2

## 2017-04-13 MED ORDER — SODIUM CHLORIDE 0.9 % IV SOLN
INTRAVENOUS | Status: DC
  Administered 2017-04-13: 09:00:00 via INTRAVENOUS

## 2017-04-13 MED ORDER — SCOPOLAMINE 1 MG/3DAYS TD PT72
1.0000 | MEDICATED_PATCH | Freq: Once | TRANSDERMAL | Status: DC
  Administered 2017-04-13: 1.5 mg via TRANSDERMAL

## 2017-04-13 MED ORDER — INSULIN ASPART 100 UNIT/ML ~~LOC~~ SOLN
10.0000 [IU] | Freq: Once | SUBCUTANEOUS | Status: AC
  Administered 2017-04-13: 10 [IU] via SUBCUTANEOUS

## 2017-04-13 SURGICAL SUPPLY — 25 items
BANDAGE ELASTIC 2 LF NS (GAUZE/BANDAGES/DRESSINGS) ×3 IMPLANT
BNDG ESMARK 4X12 TAN STRL LF (GAUZE/BANDAGES/DRESSINGS) ×3 IMPLANT
CHLORAPREP W/TINT 26ML (MISCELLANEOUS) ×3 IMPLANT
CUFF TOURN 18 STER (MISCELLANEOUS) ×3 IMPLANT
ELECT REM PT RETURN 9FT ADLT (ELECTROSURGICAL) ×3
ELECTRODE REM PT RTRN 9FT ADLT (ELECTROSURGICAL) ×1 IMPLANT
GAUZE SPONGE 4X4 12PLY STRL (GAUZE/BANDAGES/DRESSINGS) ×3 IMPLANT
GLOVE BIO SURGEON STRL SZ7.5 (GLOVE) ×3 IMPLANT
GLOVE BIO SURGEON STRL SZ8 (GLOVE) ×6 IMPLANT
GLOVE BIOGEL M STRL SZ7.5 (GLOVE) ×3 IMPLANT
GLOVE INDICATOR 8.0 STRL GRN (GLOVE) ×3 IMPLANT
GOWN STRL REUS W/ TWL LRG LVL3 (GOWN DISPOSABLE) ×2 IMPLANT
GOWN STRL REUS W/TWL LRG LVL3 (GOWN DISPOSABLE) ×4
KIT RM TURNOVER STRD PROC AR (KITS) ×3 IMPLANT
NS IRRIG 500ML POUR BTL (IV SOLUTION) ×3 IMPLANT
PACK EXTREMITY ARMC (MISCELLANEOUS) ×3 IMPLANT
PADDING CAST 2X4YD ST (MISCELLANEOUS) ×2
PADDING CAST BLEND 2X4 STRL (MISCELLANEOUS) ×1 IMPLANT
SOL PREP PVP 2OZ (MISCELLANEOUS) ×3
SOLUTION PREP PVP 2OZ (MISCELLANEOUS) ×1 IMPLANT
SPLINT CAST 1 STEP 3X12 (MISCELLANEOUS) ×3 IMPLANT
STOCKINETTE STRL 4IN 9604848 (GAUZE/BANDAGES/DRESSINGS) ×3 IMPLANT
SUT ETHILON 4-0 (SUTURE) ×2
SUT ETHILON 4-0 FS2 18XMFL BLK (SUTURE) ×1
SUTURE ETHLN 4-0 FS2 18XMF BLK (SUTURE) ×1 IMPLANT

## 2017-04-13 NOTE — Discharge Instructions (Signed)
Ice pack ° °Elevation  ° °RTC in about 2 weeks ° ° ° °AMBULATORY SURGERY  °DISCHARGE INSTRUCTIONS ° ° °1) The drugs that you were given will stay in your system until tomorrow so for the next 24 hours you should not: ° °A) Drive an automobile °B) Make any legal decisions °C) Drink any alcoholic beverage ° ° °2) You may resume regular meals tomorrow.  Today it is better to start with liquids and gradually work up to solid foods. ° °You may eat anything you prefer, but it is better to start with liquids, then soup and crackers, and gradually work up to solid foods. ° ° °3) Please notify your doctor immediately if you have any unusual bleeding, trouble breathing, redness and pain at the surgery site, drainage, fever, or pain not relieved by medication. ° ° ° °4) Additional Instructions: ° ° ° ° ° ° ° °Please contact your physician with any problems or Same Day Surgery at [phone], Monday through Friday 6 am to 4 pm, or [place] at  Main number at [phone]. °

## 2017-04-13 NOTE — Op Note (Signed)
DATE OF SURGERY:  04/13/2017  PATIENT NAME:  Jay Brown   DOB: [birth year]  MRN: [id number]  PRE-OPERATIVE DIAGNOSIS: Right carpal tunnel syndrome  POST-OPERATIVE DIAGNOSIS:  Same  PROCEDURE: Right carpal tunnel release  SURGEON: Dr. Leanor Kail, Brooke Bonito. M.D.  ANESTHESIA: Gen.   INDICATIONS FOR SURGERY: Jay Brown is a 76 y.o. year old male with a long history of numbness and paresthesias in the right hand. Nerve conduction studies demonstrated findings consistent with severe  median nerve compression.The patient had not seen any significant improvement despite conservative nonsurgical intervention. After discussion of the risks and benefits of surgical intervention, the patient expressed understanding of the risks benefits and agreed with plans for carpal tunnel release.   PROCEDURE IN DETAIL: The patient was taken the operating room where satisfactory general anesthesia was achieved. A tourniquet was placed on the patient's right upper arm.The right hand and arm were prepped  and draped in the usual sterile fashion. A "time-out" was performed as per usual protocol. The hand and forearm were exsanguinated using an Esmarch and the tourniquet was inflated to 250 mmHg.  An incision was made just ulnar to the thenar palmar crease. Dissection was carried down through the palmar fascia to the transverse carpal ligament. The transverse carpal ligament was sharply incised, taking care to protect the underlying structures within the carpal tunnel. Complete release of the transverse carpal ligament was achieved. There was no evidence of a mass or proliferative synovitis within the carpal tunnel. The median nerve did appear to be slightly flattened. The wound was irrigated with saline. The tourniquet was released at this time. It had been up for about 8 minutes. Bleeding was controlled with digital pressure and coagulation cautery. I did inject the subcutaneous tissue of the wound with about 5 cc  of 0.5% Marcaine without epinephrine. The skin was then re-approximated with interrupted sutures of #4-0 nylon. A sterile dressing was applied followed by application of a volar splint.  The patient was awakened and transferred to a stretcher bed.  The patient tolerated the procedure well and was transported to the PACU in stable condition. Blood loss was negligible.  Dr. Mariel Kansky. M.D.

## 2017-04-13 NOTE — Anesthesia Preprocedure Evaluation (Signed)
Anesthesia Evaluation  Patient identified by MRN, date of birth, ID band Patient awake    Reviewed: Allergy & Precautions, NPO status , Patient's Chart, lab work & pertinent test results  History of Anesthesia Complications (+) PONV and history of anesthetic complications  Airway Mallampati: II  TM Distance: >3 FB Neck ROM: Full    Dental  (+) Poor Dentition   Pulmonary neg sleep apnea, neg COPD, former smoker,    breath sounds clear to auscultation- rhonchi (-) wheezing      Cardiovascular Exercise Tolerance: Good hypertension, Pt. on medications (-) CAD and (-) Past MI  Rhythm:Regular Rate:Normal - Systolic murmurs and - Diastolic murmurs    Neuro/Psych negative neurological ROS  negative psych ROS   GI/Hepatic negative GI ROS, Neg liver ROS,   Endo/Other  diabetes, Insulin Dependent  Renal/GU negative Renal ROS     Musculoskeletal  (+) Arthritis ,   Abdominal (+) + obese,   Peds  Hematology negative hematology ROS (+)   Anesthesia Other Findings Past Medical History: No date: Adenomatous polyps No date: Degenerative disc disease, cervical     Comment: C5,6,7 No date: Diabetes (HCC) No date: History of kidney stones No date: Hyperlipidemia No date: Hypertension No date: Osteomyelitis (HCC)     Comment: right 3rd toe No date: Osteoporosis No date: PONV (postoperative nausea and vomiting) No date: Sepsis ([place]) No date: Skin cancer     Comment: bilateral arms and face   Reproductive/Obstetrics                             Anesthesia Physical Anesthesia Plan  ASA: III  Anesthesia Plan: General   Post-op Pain Management:    Induction: Intravenous  Airway Management Planned: LMA  Additional Equipment:   Intra-op Plan:   Post-operative Plan:   Informed Consent: I have reviewed the patients History and Physical, chart, labs and discussed the procedure including the  risks, benefits and alternatives for the proposed anesthesia with the patient or authorized representative who has indicated his/her understanding and acceptance.   Dental advisory given  Plan Discussed with: CRNA and Anesthesiologist  Anesthesia Plan Comments: (Blood sugar elevated this AM, only took 1/2 of normal dose last evening. Will cover with short-acting insulin and recheck.)        Anesthesia Quick Evaluation

## 2017-04-13 NOTE — Transfer of Care (Signed)
Immediate Anesthesia Transfer of Care Note  Patient: Jay Brown  Procedure(s) Performed: Procedure(s): CARPAL TUNNEL RELEASE (Right)  Patient Location: PACU  Anesthesia Type:General  Level of Consciousness: sedated  Airway & Oxygen Therapy: Patient Spontanous Breathing and Patient connected to face mask oxygen  Post-op Assessment: Report given to RN and Post -op Vital signs reviewed and stable  Post vital signs: Reviewed  Last Vitals:  Vitals:   04/13/17 0827 04/13/17 1010  BP: (!) 157/89 111/62  Pulse: 64 (!) 52  Resp: 16 12  Temp: 36.6 C 36.3 C    Last Pain:  Vitals:   04/13/17 0827  TempSrc: Tympanic         Complications: No apparent anesthesia complications

## 2017-04-13 NOTE — Anesthesia Post-op Follow-up Note (Cosign Needed)
Anesthesia QCDR form completed.        

## 2017-04-13 NOTE — Anesthesia Procedure Notes (Signed)
Procedure Name: LMA Insertion Performed by: Chasin Findling Pre-anesthesia Checklist: Patient identified, Patient being monitored, Timeout performed, Emergency Drugs available and Suction available Patient Re-evaluated:Patient Re-evaluated prior to inductionOxygen Delivery Method: Circle system utilized Preoxygenation: Pre-oxygenation with 100% oxygen Intubation Type: IV induction Ventilation: Mask ventilation without difficulty LMA: LMA inserted LMA Size: 4.5 Tube type: Oral Number of attempts: 1 Placement Confirmation: positive ETCO2 and breath sounds checked- equal and bilateral Tube secured with: Tape Dental Injury: Teeth and Oropharynx as per pre-operative assessment      

## 2017-04-13 NOTE — H&P (Signed)
  H and P reviewed. No changes. Uploaded at later date. 

## 2017-04-13 NOTE — Anesthesia Postprocedure Evaluation (Signed)
Anesthesia Post Note  Patient: Jay Brown  Procedure(s) Performed: Procedure(s) (LRB): CARPAL TUNNEL RELEASE (Right)  Patient location during evaluation: PACU Anesthesia Type: General Level of consciousness: awake and alert and oriented Pain management: pain level controlled Vital Signs Assessment: post-procedure vital signs reviewed and stable Respiratory status: spontaneous breathing, nonlabored ventilation and respiratory function stable Cardiovascular status: blood pressure returned to baseline and stable Postop Assessment: no signs of nausea or vomiting Anesthetic complications: no     Last Vitals:  Vitals:   04/13/17 1055 04/13/17 1105  BP: 123/70 128/73  Pulse: (!) 59 (!) 52  Resp: 13 16  Temp: 36.5 C 36.3 C    Last Pain:  Vitals:   04/13/17 1105  TempSrc: Temporal  PainSc: 0-No pain                 Lizania Bouchard

## 2017-04-27 ENCOUNTER — Other Ambulatory Visit: Payer: Self-pay | Admitting: Family Medicine

## 2017-04-27 ENCOUNTER — Ambulatory Visit (INDEPENDENT_AMBULATORY_CARE_PROVIDER_SITE_OTHER): Payer: Medicare Other | Admitting: Family Medicine

## 2017-04-27 ENCOUNTER — Encounter: Payer: Self-pay | Admitting: Family Medicine

## 2017-04-27 VITALS — BP 129/65 | HR 75 | Temp 98.2°F | Resp 16 | Ht 75.0 in | Wt 239.0 lb

## 2017-04-27 DIAGNOSIS — I10 Essential (primary) hypertension: Secondary | ICD-10-CM

## 2017-04-27 DIAGNOSIS — E785 Hyperlipidemia, unspecified: Secondary | ICD-10-CM

## 2017-04-27 DIAGNOSIS — Z794 Long term (current) use of insulin: Secondary | ICD-10-CM

## 2017-04-27 DIAGNOSIS — E1165 Type 2 diabetes mellitus with hyperglycemia: Secondary | ICD-10-CM

## 2017-04-27 DIAGNOSIS — E1169 Type 2 diabetes mellitus with other specified complication: Secondary | ICD-10-CM

## 2017-04-27 DIAGNOSIS — IMO0001 Reserved for inherently not codable concepts without codable children: Secondary | ICD-10-CM

## 2017-04-27 LAB — POCT GLYCOSYLATED HEMOGLOBIN (HGB A1C): Hemoglobin A1C: 10.6 — AB (ref ?–5.7)

## 2017-04-27 MED ORDER — ATORVASTATIN CALCIUM 10 MG PO TABS: 10.0000 mg | ORAL_TABLET | Freq: Every day | ORAL | 3 refills | Status: AC

## 2017-04-27 MED ORDER — LISINOPRIL 40 MG PO TABS: 40.0000 mg | ORAL_TABLET | Freq: Every day | ORAL | 3 refills | Status: AC

## 2017-04-27 MED ORDER — AMLODIPINE BESYLATE 2.5 MG PO TABS: 2.5000 mg | ORAL_TABLET | Freq: Every day | ORAL | 3 refills | Status: AC

## 2017-04-27 NOTE — Progress Notes (Signed)
Subjective:    Patient ID: Jay Brown, male    DOB: 1941-03-18, 76 y.o.   MRN: [id number]  Jay Brown is a 76 y.o. male presenting on 04/27/2017 for Diabetes (highest 67 snd lowest 25)   HPI   CHRONIC DM, Type 2: Reports concerns recently with R hand injury with surgery to repair injury to nerve with thenar muscle weakness, had surgery about 2 weeks ago for carpal tunnel CBGs: Avg 117-120s now did have recent elevated up to >300 for 2-3 days around recent hand surgery. Admits rare hypoglycemia 60 with symptoms, resolve quickly upon PO intake. Checks CBGs 1-2x daily Meds: Toujeo 48 units daily in evening (switched from Lantus to Toujeo about 1 year ago), Victoza 1.8mg  daily (had temporarily stopped it for few for about 1 month due to diarrhea GI intolerance, since resolved) back on it for about 3 months. Reports good compliance. 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  CHRONIC HTN: Reports checks BP at home normal readings 120-130s/60-70s Current Meds - Amlodipine 2.5mg  (Half of 5mg  tabs), Lisinopril 40mg    Reports good compliance, took meds today. Tolerating well, w/o complaints. Denies CP, dyspnea, HA, edema, dizziness / lightheadedness  PMH history of R middle hammertoe, prior toe infection history of osteomyelitis s/p amputation  Social History  Substance Use Topics  . Smoking status: Former Smoker    Packs/day: 0.50    Years: 10.00    Types: Cigarettes  . Smokeless tobacco: Never Used  . Alcohol use No    Review of Systems Per HPI unless specifically indicated  above     Objective:    BP 129/65   Pulse 75   Temp 98.2 F (36.8 C) (Oral)   Resp 16   Ht 6\' 3"  (1.905 m)   Wt 239 lb (108.4 kg)   BMI 29.87 kg/m   Wt Readings from Last 3 Encounters:  04/27/17 239 lb (108.4 kg)  04/13/17 235 lb (106.6 kg)  04/04/17 235 lb (106.6 kg)    Physical Exam  Constitutional: He is oriented to person, place, and time. He appears well-developed and well-nourished. No distress.  Well-appearing, comfortable, cooperative  HENT:  Head: Normocephalic and atraumatic.  Mouth/Throat: Oropharynx is clear and moist.  Eyes: Conjunctivae are normal.  Neck: Normal range of motion. Neck supple.  Cardiovascular: Normal rate, regular rhythm, normal heart sounds and intact distal pulses.   No murmur heard. Pulmonary/Chest: Effort normal and breath sounds normal. No respiratory distress. He has no wheezes. He has no rales.  Musculoskeletal: He exhibits no edema.  Lymphadenopathy:    He has no cervical adenopathy.  Neurological: He is alert and oriented to person, place, and time.  See DM foot exam below  S/p R hand carpal tunnel release  Skin: Skin is warm and dry. No rash noted. He is not diaphoretic. No erythema.  Psychiatric: He has a normal mood and affect. His behavior is normal.  Nursing note and vitals reviewed.     Diabetic Foot Exam - Simple   Simple Foot Form Diabetic Foot exam was performed with the following findings:  Yes 04/27/2017  8:35 AM  Visual Inspection See comments:  Yes Sensation Testing See comments:  Yes Pulse Check Posterior Tibialis and Dorsalis pulse intact bilaterally:  Yes Comments Right foot forefoot and toes with diminished sensation to monofilament, light touch intact, rest of foot intact to monofilament, dry skin callus formation, old nerve injury due to laceration known. 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 Some deformity of nails thickening  callus     Results for orders placed or performed in visit on 04/27/17  POCT HgB A1C  Result Value Ref Range   Hemoglobin A1C 10.6 (A) 5.7      Assessment & Plan:   Problem List Items Addressed This Visit    Uncontrolled diabetes mellitus type 2 without complications ([place]) - Primary    Uncontrolled DM, now with A1c up to 10.6 previously variable, with higher 8s No known complications or hypoglycemia. Now concern for some mild neuropathy.  Plan:  1. Continue current therapy for now - discussion on diabetes complications, management, and importance of controlling. 2. Emphasized making improvement to lifestyle with diabetic diet, low carb, improve regular exercise 3. Continue current Toujeo 48u evening, may titrate as needed. Also continue Victoza 1.8mg  inj daily for now - until run out, then will try to switch to weekly injectable GLP1 to see if better adjusted results - Patient to check with ins to see coverage/cost of Bydureon / Trulicity, once determine which preferred, will send that rx into pharmacy for him to start after finishes his Victoza supply 4. Follow-up 3 months for DM A1c - may need to consider adding oral medication as well such as resume metformin vs SGLT2 add on       Relevant Medications   liraglutide (VICTOZA) 18 MG/3ML SOPN   Other Relevant Orders   POCT HgB A1C (Completed)   Hypertension    Stable, controlled HTN Without known complications  Plan: 1. Continue current regimen - Amlodipine 2.5mg  (half of 5) daily, Lisinopril 40mg  daily 2. Encourage continue to improve diet and lifestyle, resume regular exercise 3. Continue to monitor BP outside office 4. Follow-up 3 months         Meds ordered this encounter  Medications  . liraglutide (VICTOZA) 18 MG/3ML SOPN    Sig: Inject 0.3 mLs (1.8 mg total) into the skin daily.     Follow up plan: Return in about 3 months (around 07/28/2017) for Diabetes A1c, HTN.  Nobie Putnam, [facility] 04/28/2017, 12:02 AM

## 2017-04-27 NOTE — Assessment & Plan Note (Signed)
Stable, controlled HTN Without known complications  Plan: 1. Continue current regimen - Amlodipine 2.5mg  (half of 5) daily, Lisinopril 40mg  daily 2. Encourage continue to improve diet and lifestyle, resume regular exercise 3. Continue to monitor BP outside office 4. Follow-up 3 months

## 2017-04-27 NOTE — Patient Instructions (Signed)
Thank you for coming to the clinic today.  1. Diabetes does not seem well controlled, A1c elevated up to 10.6  When run out of Victoza, then let me know  Contact insurance to check cost and coverage on these. Tried victoza max dose without improvement  1. Bydureon BCise (Exenatide ER) - they also make a regular Bydureon Pen 2. Trulicity (Dulaglutide)  Try to keep working on [employer], low carbs. Stay more active with some exercise and work at home.  Please schedule a follow-up appointment with Dr. Parks Ranger in 3 months Diabetes A1c, HTN  If you have any other questions or concerns, please feel free to call the clinic or send a message through [place]. You may also schedule an earlier appointment if necessary.  Nobie Putnam, DO [place]

## 2017-04-28 NOTE — Assessment & Plan Note (Signed)
Uncontrolled DM, now with A1c up to 10.6 previously variable, with higher 8s No known complications or hypoglycemia. Now concern for some mild neuropathy.  Plan:  1. Continue current therapy for now - discussion on diabetes complications, management, and importance of controlling. 2. Emphasized making improvement to lifestyle with diabetic diet, low carb, improve regular exercise 3. Continue current Toujeo 48u evening, may titrate as needed. Also continue Victoza 1.8mg  inj daily for now - until run out, then will try to switch to weekly injectable GLP1 to see if better adjusted results - Patient to check with ins to see coverage/cost of Bydureon / Trulicity, once determine which preferred, will send that rx into pharmacy for him to start after finishes his Victoza supply 4. Follow-up 3 months for DM A1c - may need to consider adding oral medication as well such as resume metformin vs SGLT2 add on

## 2017-04-29 ENCOUNTER — Telehealth: Payer: Self-pay | Admitting: Family Medicine

## 2017-04-29 DIAGNOSIS — IMO0001 Reserved for inherently not codable concepts without codable children: Secondary | ICD-10-CM

## 2017-04-29 DIAGNOSIS — Z794 Long term (current) use of insulin: Principal | ICD-10-CM

## 2017-04-29 DIAGNOSIS — E1165 Type 2 diabetes mellitus with hyperglycemia: Principal | ICD-10-CM

## 2017-04-29 MED ORDER — LIRAGLUTIDE 18 MG/3ML ~~LOC~~ SOPN: 1.8000 mg | PEN_INJECTOR | Freq: Every day | SUBCUTANEOUS | 1 refills | Status: DC

## 2017-04-29 NOTE — Telephone Encounter (Signed)
Pt. Wife  Called  requesting a refill she said  You  Gave him a new med name was not given, so not sure what refill she was requesting ( she  Stated that you took him off Victoza). Pt. Call back number [phone]

## 2017-04-29 NOTE — Addendum Note (Signed)
Addended by: Olin Hauser on: 04/29/2017 01:25 PM   Modules accepted: Orders

## 2017-04-29 NOTE — Telephone Encounter (Signed)
Pt advised has 2 more pens for Victoza and wife will call insurance company.

## 2017-04-29 NOTE — Telephone Encounter (Addendum)
Patient seen on in office on 5/30.  He was never started on new medication at that time.  I was under the impression that he already had plenty of Victoza to continue for now. I will SEND IN NEW rx now for Victoza for 1 month.  I asked him to check with his insurance company first to check cost and coverage of the following medications:  1. Bydureon BCise (Exenatide ER) 2. Trulicity (Dulaglutide)  Once he finds out which of these two meds will be covered or preferred, and if the price is acceptable, he will need to CONTACT us with the requested name of medication, and then I will send a new rx to start one of these.  For now he should continue Victoza, and only stop Victoza WHEN he starts the new medication instead.  -----------------------------------------------------  Could you please call back to clarify these instructions?  Nobie Putnam, [facility] 04/29/2017, 12:29 PM

## 2017-05-06 MED ORDER — DULAGLUTIDE 1.5 MG/0.5ML ~~LOC~~ SOAJ: 1.5000 mg | SUBCUTANEOUS | 11 refills | Status: DC

## 2017-05-06 NOTE — Addendum Note (Signed)
Addended by: Olin Hauser on: 05/06/2017 08:19 AM   Modules accepted: Orders

## 2017-05-06 NOTE — Telephone Encounter (Addendum)
We never heard back from patient or wife with their insurance co preference on Trulicity vs Bydureon BCise. There seems to be some confusion.  I received another fax on 05/04/17 from pharmacy trying to clarify which rx that I would be replacing Victoza with, and I have discontinued Victoza now and submitted new rx for Trulicity 1.5mg  weekly inj. If this is not covered then we will try next option.  Nobie Putnam, [facility] 05/06/2017, 8:19 AM

## 2017-05-16 ENCOUNTER — Other Ambulatory Visit: Payer: Self-pay | Admitting: Family Medicine

## 2017-05-16 ENCOUNTER — Telehealth: Payer: Self-pay

## 2017-05-16 DIAGNOSIS — E1165 Type 2 diabetes mellitus with hyperglycemia: Principal | ICD-10-CM

## 2017-05-16 DIAGNOSIS — Z794 Long term (current) use of insulin: Principal | ICD-10-CM

## 2017-05-16 DIAGNOSIS — IMO0001 Reserved for inherently not codable concepts without codable children: Secondary | ICD-10-CM

## 2017-05-16 MED ORDER — LIRAGLUTIDE 18 MG/3ML ~~LOC~~ SOPN: 1.8000 mg | PEN_INJECTOR | Freq: Every day | SUBCUTANEOUS | 1 refills | Status: DC

## 2017-05-16 NOTE — Telephone Encounter (Signed)
Pt's wife advised as per Dr. Parks Ranger and pt will take Trulicity at lunch time and notify office if he would still have Sx.

## 2017-05-16 NOTE — Telephone Encounter (Addendum)
These can be some symptoms after starting the Trulicity injection. He should always take it with food or else these symptoms will be worse. Often these reduce in severity after first month of taking the medicine. I would recommend that he continue to take it every week for now, due to his uncontrolled diabetes.  In the future, if still unable to tolerate it we can reduce the dose, but I do not recommend this. Or we could switch to a different brand such as Bydureon BCise if better tolerated.  Jay Brown, [facility] 05/16/2017, 11:42 AM

## 2017-05-16 NOTE — Addendum Note (Signed)
Addended by: Olin Hauser on: 05/16/2017 01:30 PM   Modules accepted: Orders

## 2017-05-16 NOTE — Telephone Encounter (Signed)
As per patient when he started Trulicity last Wednesday he felt fatigue, having no energy,  Dizzy after 10:00 am but morning is fine so he will not take for next Wednesday.

## 2017-05-16 NOTE — Telephone Encounter (Signed)
Patient's wife called back again.  He will not take another dose of Trulicity. His preference.  He would like to resume Victoza 1.8mg  daily injection, 90 day supply, follow-up as scheduled 3 months from last visit to check A1c.  I advised, we can trend A1c, limited other options available if he cannot take these medicines. He understands.  DC Trulicity and new rx Victoza sent.  Nobie Putnam, [facility] 05/16/2017, 1:28 PM

## 2017-05-16 NOTE — Telephone Encounter (Signed)
If patient calls back this week with recurrence of symptoms, my next recommendations are the following:  1. Discontinue Trulicity due to intolerance / side effect - and can send new rx Bydureon BCise to pharmacy, 2mg  weekly inj (may have to complete Prior Authorization, failed Victoza 1.8 max dose without benefit, and Trulicity with side effect)  OR  2. Discontinue Trulicity and resume Victoza 1.8mg  daily injection, same dose as before.  Nobie Putnam, [facility] 05/16/2017, 12:56 PM

## 2017-06-28 ENCOUNTER — Telehealth: Payer: Self-pay | Admitting: Family Medicine

## 2017-06-28 NOTE — Telephone Encounter (Signed)
Called pt to schedule for Annual Wellness Visit with Nurse Health Advisor, Tiffany Hill, my c/b # is [phone]  Kathryn Brown ° °

## 2017-09-20 ENCOUNTER — Telehealth: Payer: Self-pay

## 2017-09-21 ENCOUNTER — Other Ambulatory Visit: Payer: Self-pay

## 2017-09-21 DIAGNOSIS — E0822 Diabetes mellitus due to underlying condition with diabetic chronic kidney disease: Secondary | ICD-10-CM

## 2017-09-21 DIAGNOSIS — N181 Chronic kidney disease, stage 1: Principal | ICD-10-CM

## 2017-09-21 MED ORDER — INSULIN GLARGINE 300 UNIT/ML ~~LOC~~ SOPN: 46.0000 [IU] | PEN_INJECTOR | SUBCUTANEOUS | 3 refills | Status: DC

## 2017-09-28 NOTE — Telephone Encounter (Signed)
Open in error

## 2018-01-24 ENCOUNTER — Other Ambulatory Visit: Payer: Self-pay | Admitting: Family Medicine

## 2018-01-24 ENCOUNTER — Ambulatory Visit: Payer: Medicare Other | Admitting: Family Medicine

## 2018-01-24 ENCOUNTER — Encounter: Payer: Self-pay | Admitting: Family Medicine

## 2018-01-24 VITALS — BP 139/64 | HR 63 | Temp 97.5°F | Resp 16 | Ht 75.0 in | Wt 241.0 lb

## 2018-01-24 DIAGNOSIS — R635 Abnormal weight gain: Secondary | ICD-10-CM

## 2018-01-24 DIAGNOSIS — T733XXA Exhaustion due to excessive exertion, initial encounter: Secondary | ICD-10-CM | POA: Diagnosis not present

## 2018-01-24 DIAGNOSIS — E1165 Type 2 diabetes mellitus with hyperglycemia: Secondary | ICD-10-CM | POA: Diagnosis not present

## 2018-01-24 DIAGNOSIS — E1169 Type 2 diabetes mellitus with other specified complication: Secondary | ICD-10-CM

## 2018-01-24 DIAGNOSIS — I10 Essential (primary) hypertension: Secondary | ICD-10-CM

## 2018-01-24 DIAGNOSIS — IMO0001 Reserved for inherently not codable concepts without codable children: Secondary | ICD-10-CM

## 2018-01-24 DIAGNOSIS — Z0000 Encounter for general adult medical examination without abnormal findings: Secondary | ICD-10-CM

## 2018-01-24 DIAGNOSIS — Z125 Encounter for screening for malignant neoplasm of prostate: Secondary | ICD-10-CM

## 2018-01-24 DIAGNOSIS — E785 Hyperlipidemia, unspecified: Secondary | ICD-10-CM

## 2018-01-24 NOTE — Assessment & Plan Note (Signed)
Uncontrolled DM, prior reading elevated up to 10.6, today POC 12.2, concern this may not be accurate based on CBG readings of avg < 150 but it may be as he still has high readings and some lifestyle non adherence No known complications or hypoglycemia. Now concern for some mild neuropathy. - Failed Victoza 1.8 (GI intolerance diarrhea)  Plan:  1. Re-check A1c serum lab today - will f/u result - discussed options, likely if A1c >8.0 still or if >10-12 then will need to add back GLP1 agent, discussed Trulicity 0.75mg  weekly inj, demo pen showed and given 1 month free coupon, will send to pharmacy if indicated, and continue at low dose for first few months until stable and without side effects reviewed in office, along with benefits of med, may increase to 1.5 in future at follow-up if needed then goal of reducing insulin Toujeo in future - For now continue Toujeo 48 u nightly dosing - Emphasized making improvement to lifestyle with diabetic diet, low carb, improve regular exercise Follow-up 3 months for Annual + labs including A1c

## 2018-01-24 NOTE — Patient Instructions (Addendum)
Thank you for coming to the office today.  1. Continue current Toujeo insulin  We will re-check A1c sugar today - stay tuned for result, will call with this in few days  If A1c sugar is >8 or significantly elevated we will send in new injectable medicine Trulicity (Dulaglutide) - this is a once a week injection  Take in morning with breakfast, may continue  Please call to schedule Diabetic Eye Exam at [facility] - make sure they fax [place] a copy of the report  DUE for FASTING BLOOD WORK (no food or drink after midnight before the lab appointment, only water or coffee without cream/sugar on the morning of)  SCHEDULE "Lab Only" visit in the morning at the clinic for lab draw in 3 MONTHS   - Make sure Lab Only appointment is at about 1 week before your next appointment, so that results will be available  For Lab Results, once available within 2-3 days of blood draw, you can can log in to MyChart online to view your results and a brief explanation. Also, we can discuss results at next follow-up visit.   Please schedule a Follow-up Appointment to: Return in about 3 months (around 04/23/2018) for Annual Physical.    If you have any other questions or concerns, please feel free to call the office or send a message through [place]. You may also schedule an earlier appointment if necessary.  Additionally, you may be receiving a survey about your experience at our office within a few days to 1 week by e-mail or mail. We value your feedback.  Nobie Putnam, DO [place]

## 2018-01-24 NOTE — Progress Notes (Signed)
Subjective:    Patient ID: Jay Brown, male    DOB: 05-01-1941, 77 y.o.   MRN: [id number]  Jay Brown is a 77 y.o. male presenting on 01/24/2018 for Diabetes (highest 125 and lowest 80)   HPI  CHRONIC DM, Type 2: Follow-up since last visit 03/2017 for DM management, he was not sure when to return, did not adhere to follow-up recommendations. See prior note for background CBGs: Avg 120-130s, Low 70-80s, highest 280 Meds: Toujeo 48 units daily in evening w/ dinner - Off Victoza 1.8mg  daily (had temporarily stopped it for few for about 1 month due to diarrhea GI intolerance) Reports good compliance. 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  Additional complaint - Reports some gradual worsening exertional fatigue, without exertional chest pain or tightness or dyspnea, seems he can work outside 2-3 hours actively and then will get more tired and fatigued than usual, but he will rest for 1 min or brief period and is ready to go back to work. He does not have any symptoms at rest. He had history of cardiac stress test done at [facility] years ago, was told it was normal, he is asking if he needs to do this again. He does not have any other heart history, will discuss this further next visit as per his request, he is not ready for referral  Depression screen [facility] 2/9 01/24/2018 01/20/2017 12/14/2016  Decreased Interest 0 0 0  Down, Depressed, Hopeless 0 0 0  PHQ - 2 Score 0 0 0    Social History   Tobacco Use  . Smoking status: Former Smoker    Packs/day: 0.50    Years: 10.00   Pack years: 5.00    Types: Cigarettes  . Smokeless tobacco: Never Used  Substance Use Topics  . Alcohol use: No    Alcohol/week: 0.0 oz  . Drug use: No    Review of Systems Per HPI unless specifically indicated above     Objective:    BP 139/64   Pulse 63   Temp (!) 97.5 F (36.4 C) (Oral)   Resp 16   Ht 6\' 3"  (1.905 m)   Wt 241 lb (109.3 kg)   BMI 30.12 kg/m   Wt Readings from Last 3 Encounters:  01/24/18 241 lb (109.3 kg)  04/27/17 239 lb (108.4 kg)  04/13/17 235 lb (106.6 kg)    Physical Exam  Constitutional: He is oriented to person, place, and time. He appears well-developed and well-nourished. No distress.  Well-appearing, comfortable, cooperative, overweight/obese with large body frame  HENT:  Head: Normocephalic and atraumatic.  Mouth/Throat: Oropharynx is clear and moist.  Eyes: Conjunctivae are normal. Right eye exhibits no discharge. Left eye exhibits no discharge.  Neck: Normal range of motion. Neck supple.  Cardiovascular: Normal rate, regular rhythm, normal heart sounds and intact distal pulses.  No murmur heard. Pulmonary/Chest: Effort normal and breath sounds normal. No respiratory distress. He has no wheezes. He has no rales.  Musculoskeletal: Normal range of motion. He exhibits no edema.  Neurological: He is alert and oriented to person, place, and time.  Skin:  Skin is warm and dry. No rash noted. He is not diaphoretic. No erythema.  Psychiatric: He has a normal mood and affect. His behavior is normal.  Well groomed, good eye contact, normal speech and thoughts  Nursing note and vitals reviewed.    Results for orders placed or performed in visit on 04/27/17  POCT HgB A1C  Result Value Ref Range   Hemoglobin A1C 10.6 (A) 5.7   Recent Labs    01/25/17 0001 04/27/17 0851  HGBA1C 8.3* 10.6*       Assessment & Plan:   Problem List Items Addressed This Visit    Uncontrolled diabetes mellitus type 2 without complications ([place]) - Primary     Uncontrolled DM, prior reading elevated up to 10.6, today POC 12.2, concern this may not be accurate based on CBG readings of avg < 150 but it may be as he still has high readings and some lifestyle non adherence No known complications or hypoglycemia. Now concern for some mild neuropathy. - Failed Victoza 1.8 (GI intolerance diarrhea)  Plan:  1. Re-check A1c serum lab today - will f/u result - discussed options, likely if A1c >8.0 still or if >10-12 then will need to add back GLP1 agent, discussed Trulicity 0.75mg  weekly inj, demo pen showed and given 1 month free coupon, will send to pharmacy if indicated, and continue at low dose for first few months until stable and without side effects reviewed in office, along with benefits of med, may increase to 1.5 in future at follow-up if needed then goal of reducing insulin Toujeo in future - For now continue Toujeo 48 u nightly dosing - Emphasized making improvement to lifestyle with diabetic diet, low carb, improve regular exercise Follow-up 3 months for Annual + labs including A1c       Relevant Orders   Hemoglobin A1c    Other Visit Diagnoses    Fatigue due to excessive exertion, initial encounter       Suspected normal with age and also can be related to poorly controlled A1c. Prior cardiac w/u stress negative by report [facility] years ago, if worsening criteria advised we would likely refer to local Cardiology for stress test and evaluation at least triage      No orders of the defined types were placed in this encounter.   Follow up plan: Return in about 3 months (around 04/23/2018) for Annual Physical.  Future labs ordered for 03/2018  A total of >25, 40 minutes was spent face-to-face with this patient. Greater than 50% of this time was spent in counseling on Diabetes complications, prognosis, treatment options medicines, including specifically GLP1 agents benefits/risks, dosing and future use.  Nobie Putnam, [facility] 01/24/2018, 12:56 PM

## 2018-01-25 LAB — HEMOGLOBIN A1C
EAG (MMOL/L): 17 (calc)
Hgb A1c MFr Bld: 12.3 % of total Hgb — ABNORMAL HIGH (ref <5.7)
MEAN PLASMA GLUCOSE: 306 (calc)

## 2018-01-26 ENCOUNTER — Other Ambulatory Visit: Payer: Self-pay | Admitting: Family Medicine

## 2018-01-26 DIAGNOSIS — E1165 Type 2 diabetes mellitus with hyperglycemia: Principal | ICD-10-CM

## 2018-01-26 DIAGNOSIS — IMO0001 Reserved for inherently not codable concepts without codable children: Secondary | ICD-10-CM

## 2018-01-26 MED ORDER — DULAGLUTIDE 0.75 MG/0.5ML ~~LOC~~ SOAJ: 0.7500 mg | SUBCUTANEOUS | 3 refills | Status: DC

## 2018-02-06 ENCOUNTER — Other Ambulatory Visit: Payer: Self-pay | Admitting: Physician Assistant

## 2018-02-06 DIAGNOSIS — M25512 Pain in left shoulder: Secondary | ICD-10-CM

## 2018-02-06 DIAGNOSIS — M75122 Complete rotator cuff tear or rupture of left shoulder, not specified as traumatic: Secondary | ICD-10-CM

## 2018-02-15 ENCOUNTER — Ambulatory Visit
Admission: RE | Admit: 2018-02-15 | Discharge: 2018-02-15 | Disposition: A | Discharge status: Home / Self Care | Payer: Medicare Other | Source: Ambulatory Visit | Attending: Physician Assistant | Admitting: Physician Assistant

## 2018-02-15 DIAGNOSIS — M75122 Complete rotator cuff tear or rupture of left shoulder, not specified as traumatic: Secondary | ICD-10-CM | POA: Diagnosis present

## 2018-02-15 DIAGNOSIS — M25512 Pain in left shoulder: Secondary | ICD-10-CM | POA: Diagnosis present

## 2018-02-23 ENCOUNTER — Other Ambulatory Visit: Payer: Self-pay

## 2018-02-23 DIAGNOSIS — IMO0001 Reserved for inherently not codable concepts without codable children: Secondary | ICD-10-CM

## 2018-02-23 DIAGNOSIS — E1165 Type 2 diabetes mellitus with hyperglycemia: Principal | ICD-10-CM

## 2018-02-23 MED ORDER — DULAGLUTIDE 0.75 MG/0.5ML ~~LOC~~ SOAJ: 0.7500 mg | SUBCUTANEOUS | 3 refills | Status: DC

## 2018-03-03 ENCOUNTER — Other Ambulatory Visit: Payer: Self-pay | Admitting: Family Medicine

## 2018-03-03 NOTE — Telephone Encounter (Signed)
The pt is not requesting Cipro.  Pt wife requested that we dc that prescription.

## 2018-03-03 NOTE — Telephone Encounter (Addendum)
Medicine is not on his list as an active med.  Could you call [facility] to request that it is discontinued and no further refill requests be sent?  Thanks  Nobie Putnam, [facility] 03/03/2018, 5:23 PM

## 2018-03-03 NOTE — Telephone Encounter (Signed)
Received 2nd attempt fax from [facility] for refill on Cipro antibiotic 500mg  BID x 10 days.  I have never rx this antibiotic for him. He was last treated for this by Dr Luan Pulling in 11/2016. It was for infectious diarrhea.  ----------------------------------------------  Please contact patient to find out what his symptoms or concerns are, and he will need to schedule an office visit to discuss further including testing if he needs this same treatment again.  If this is a mistake from pharmacy, and he is not requesting it then we do not need to do anything else.  Will shred this paper request at this time.  Nobie Putnam, [facility] 03/03/2018, 12:06 PM

## 2018-03-06 ENCOUNTER — Other Ambulatory Visit: Payer: Self-pay

## 2018-03-06 ENCOUNTER — Encounter
Admission: RE | Admit: 2018-03-06 | Discharge: 2018-03-06 | Disposition: A | Discharge status: Home / Self Care | Payer: Medicare Other | Source: Ambulatory Visit | Attending: Surgery | Admitting: Surgery

## 2018-03-06 DIAGNOSIS — M75122 Complete rotator cuff tear or rupture of left shoulder, not specified as traumatic: Secondary | ICD-10-CM | POA: Insufficient documentation

## 2018-03-06 DIAGNOSIS — Z01812 Encounter for preprocedural laboratory examination: Secondary | ICD-10-CM | POA: Insufficient documentation

## 2018-03-06 DIAGNOSIS — M129 Arthropathy, unspecified: Secondary | ICD-10-CM | POA: Insufficient documentation

## 2018-03-06 LAB — PROTIME-INR
INR: 0.92
Prothrombin Time: 12.3 seconds (ref 11.4–15.2)

## 2018-03-06 LAB — SURGICAL PCR SCREEN
MRSA, PCR: NEGATIVE
Staphylococcus aureus: NEGATIVE

## 2018-03-06 LAB — CBC
HEMATOCRIT: 41.9 % (ref 40.0–52.0)
Hemoglobin: 14 g/dL (ref 13.0–18.0)
MCH: 28.9 pg (ref 26.0–34.0)
MCHC: 33.3 g/dL (ref 32.0–36.0)
MCV: 86.8 fL (ref 80.0–100.0)
Platelets: 229 10*3/uL (ref 150–440)
RBC: 4.83 MIL/uL (ref 4.40–5.90)
RDW: 13.5 % (ref 11.5–14.5)
WBC: 9 10*3/uL (ref 3.8–10.6)

## 2018-03-06 LAB — BASIC METABOLIC PANEL
Anion gap: 5 (ref 5–15)
BUN: 16 mg/dL (ref 6–20)
CHLORIDE: 105 mmol/L (ref 101–111)
CO2: 26 mmol/L (ref 22–32)
CREATININE: 1.06 mg/dL (ref 0.61–1.24)
Calcium: 8.9 mg/dL (ref 8.9–10.3)
GFR calc Af Amer: >60 mL/min (ref >60)
GFR calc non Af Amer: >60 mL/min (ref >60)
GLUCOSE: 229 mg/dL — AB (ref 65–99)
Potassium: 3.7 mmol/L (ref 3.5–5.1)
SODIUM: 136 mmol/L (ref 135–145)

## 2018-03-06 LAB — URINALYSIS, ROUTINE W REFLEX MICROSCOPIC
Bilirubin Urine: NEGATIVE
Glucose, UA: 150 mg/dL — AB
Hgb urine dipstick: NEGATIVE
Ketones, ur: 5 mg/dL — AB
LEUKOCYTES UA: NEGATIVE
NITRITE: NEGATIVE
PH: 5 (ref 5.0–8.0)
Protein, ur: NEGATIVE mg/dL
Specific Gravity, Urine: 1.021 (ref 1.005–1.030)

## 2018-03-06 LAB — TYPE AND SCREEN
ABO/RH(D): A POS
ANTIBODY SCREEN: NEGATIVE

## 2018-03-06 NOTE — Patient Instructions (Signed)
Your procedure is scheduled on: 03/14/18 Report to Day Surgery. [facility] SECOND FLOOR To find out your arrival time please call [phone] between 1PM - 3PM on 03/13/18 Remember: Instructions that are not followed completely may result in serious medical risk, up to and including death, or upon the discretion of your surgeon and anesthesiologist your surgery may need to be rescheduled.     _X__ 1. Do not eat food after midnight the night before your procedure.                 No gum chewing or hard candies. You may drink clear liquids up to 2 hours                 before you are scheduled to arrive for your surgery- DO not drink clear                 liquids within 2 hours of the start of your surgery.                 Clear Liquids include:  water, apple juice without pulp, clear carbohydrate                 drink such as Clearfast of Gartorade, Black Coffee or Tea (Do not add                 anything to coffee or tea).  __X__2.  On the morning of surgery brush your teeth with toothpaste and water, you                 may rinse your mouth with mouthwash if you wish.  Do not swallow any              toothpaste of mouthwash.     _X__ 3.  No Alcohol for 24 hours before or after surgery.   _X__ 4.  Do Not Smoke or use e-cigarettes For 24 Hours Prior to Your Surgery.                 Do not use any chewable tobacco products for at least 6 hours prior to                 surgery.  ____  5.  Bring all medications with you on the day of surgery if instructed.   _X___  6.  Notify your doctor if there is any change in your medical condition      (cold, fever, infections).     Do not wear jewelry, make-up, hairpins, clips or nail polish. Do not wear lotions, powders, or perfumes. You may wear deodorant. Do not shave 48 hours prior to surgery. Men may shave face and neck. Do not bring valuables to the hospital.    [facility] is not responsible for any belongings or  valuables.  Contacts, dentures or bridgework may not be worn into surgery. Leave your suitcase in the car. After surgery it may be brought to your room. For patients admitted to the hospital, discharge time is determined by your treatment team.   Patients discharged the day of surgery will not be allowed to drive home.   Please read over the following fact sheets that you were given:   Surgical Site Infection Prevention / MRSA  __Take these medicines the morning of surgery with A SIP OF WATER:    1. NONE  2.   3.   4.  5.  6.  ____ Fleet Enema (  as directed)   _X___ Use CHG Soap as directed  ____ Use inhalers on the day of surgery  ____ Stop metformin 2 days prior to surgery    _X___ Take 1/2 of usual insulin dose the night before surgery. No insulin the morning          of surgery.   ____ Stop Coumadin/Plavix/aspirin on  ____ Stop Anti-inflammatories on  ____ Stop supplements until after surgery.    ____ Bring C-Pap to the hospital.

## 2018-03-07 LAB — URINE CULTURE: Culture: NO GROWTH

## 2018-03-08 NOTE — Telephone Encounter (Signed)
Called pharmacy

## 2018-03-10 ENCOUNTER — Telehealth: Payer: Self-pay | Admitting: Family Medicine

## 2018-03-10 NOTE — Telephone Encounter (Signed)
LM re need to schedule MWV a NHA prior to physical 04/27/2018

## 2018-03-14 ENCOUNTER — Inpatient Hospital Stay: Payer: Medicare Other | Admitting: Certified Registered Nurse Anesthetist

## 2018-03-14 ENCOUNTER — Other Ambulatory Visit: Payer: Self-pay

## 2018-03-14 ENCOUNTER — Inpatient Hospital Stay: Payer: Medicare Other

## 2018-03-14 ENCOUNTER — Inpatient Hospital Stay
Admission: RE | Admit: 2018-03-14 | Discharge: 2018-03-15 | DRG: 483 | Disposition: A | Discharge status: Home / Self Care | Payer: Medicare Other | Source: Ambulatory Visit | Attending: Surgery | Admitting: Surgery

## 2018-03-14 ENCOUNTER — Encounter
Admission: RE | Disposition: A | Discharge status: Home / Self Care | Payer: Self-pay | Source: Ambulatory Visit | Attending: Surgery

## 2018-03-14 DIAGNOSIS — M810 Age-related osteoporosis without current pathological fracture: Secondary | ICD-10-CM | POA: Diagnosis present

## 2018-03-14 DIAGNOSIS — Z85828 Personal history of other malignant neoplasm of skin: Secondary | ICD-10-CM | POA: Diagnosis not present

## 2018-03-14 DIAGNOSIS — I10 Essential (primary) hypertension: Secondary | ICD-10-CM | POA: Diagnosis present

## 2018-03-14 DIAGNOSIS — Z87891 Personal history of nicotine dependence: Secondary | ICD-10-CM | POA: Diagnosis not present

## 2018-03-14 DIAGNOSIS — M19012 Primary osteoarthritis, left shoulder: Secondary | ICD-10-CM | POA: Diagnosis present

## 2018-03-14 DIAGNOSIS — E785 Hyperlipidemia, unspecified: Secondary | ICD-10-CM | POA: Diagnosis present

## 2018-03-14 DIAGNOSIS — E119 Type 2 diabetes mellitus without complications: Secondary | ICD-10-CM | POA: Diagnosis present

## 2018-03-14 DIAGNOSIS — Z96612 Presence of left artificial shoulder joint: Secondary | ICD-10-CM

## 2018-03-14 DIAGNOSIS — M25512 Pain in left shoulder: Secondary | ICD-10-CM | POA: Diagnosis present

## 2018-03-14 HISTORY — PX: REVERSE SHOULDER ARTHROPLASTY: SHX5054

## 2018-03-14 LAB — GLUCOSE, CAPILLARY
GLUCOSE-CAPILLARY: 201 mg/dL — AB (ref 65–99)
GLUCOSE-CAPILLARY: 183 mg/dL — AB (ref 65–99)
GLUCOSE-CAPILLARY: 188 mg/dL — AB (ref 65–99)
Glucose-Capillary: 211 mg/dL — ABNORMAL HIGH (ref 65–99)
Glucose-Capillary: 153 mg/dL — ABNORMAL HIGH (ref 65–99)

## 2018-03-14 LAB — ABO/RH: ABO/RH(D): A POS

## 2018-03-14 SURGERY — ARTHROPLASTY, SHOULDER, TOTAL, REVERSE
Anesthesia: General | Site: Shoulder | Laterality: Left | Wound class: Clean

## 2018-03-14 MED ORDER — DIPHENHYDRAMINE HCL 12.5 MG/5ML PO ELIX
12.5000 mg | ORAL_SOLUTION | ORAL | Status: DC | PRN
  Administered 2018-03-14: 25 mg via ORAL
  Filled 2018-03-14: qty 10

## 2018-03-14 MED ORDER — ONDANSETRON HCL 4 MG PO TABS: 4.0000 mg | ORAL_TABLET | Freq: Four times a day (QID) | ORAL | Status: DC | PRN

## 2018-03-14 MED ORDER — TRANEXAMIC ACID 1000 MG/10ML IV SOLN
INTRAVENOUS | Status: AC
  Filled 2018-03-14: qty 10

## 2018-03-14 MED ORDER — BUPIVACAINE LIPOSOME 1.3 % IJ SUSP
INTRAMUSCULAR | Status: AC
  Filled 2018-03-14: qty 20

## 2018-03-14 MED ORDER — ONDANSETRON HCL 4 MG/2ML IJ SOLN: 4.0000 mg | Freq: Four times a day (QID) | INTRAMUSCULAR | Status: DC | PRN

## 2018-03-14 MED ORDER — MIDAZOLAM HCL 2 MG/2ML IJ SOLN
1.0000 mg | Freq: Once | INTRAMUSCULAR | Status: AC
  Administered 2018-03-14: 1 mg via INTRAVENOUS

## 2018-03-14 MED ORDER — HYDROCODONE-ACETAMINOPHEN 5-325 MG PO TABS
1.0000 | ORAL_TABLET | ORAL | Status: DC | PRN
  Filled 2018-03-14: qty 1

## 2018-03-14 MED ORDER — PHENYLEPHRINE HCL 10 MG/ML IJ SOLN
INTRAMUSCULAR | Status: AC
  Filled 2018-03-14: qty 1

## 2018-03-14 MED ORDER — FENTANYL CITRATE (PF) 250 MCG/5ML IJ SOLN
INTRAMUSCULAR | Status: AC
  Filled 2018-03-14: qty 5

## 2018-03-14 MED ORDER — ACETAMINOPHEN 10 MG/ML IV SOLN
INTRAVENOUS | Status: AC
  Filled 2018-03-14: qty 100

## 2018-03-14 MED ORDER — SUGAMMADEX SODIUM 500 MG/5ML IV SOLN
INTRAVENOUS | Status: AC
  Filled 2018-03-14: qty 5

## 2018-03-14 MED ORDER — FAMOTIDINE 20 MG PO TABS
ORAL_TABLET | ORAL | Status: AC
  Administered 2018-03-14: 20 mg via ORAL
  Filled 2018-03-14: qty 1

## 2018-03-14 MED ORDER — SODIUM CHLORIDE 0.9 % IV SOLN
INTRAVENOUS | Status: DC
  Administered 2018-03-14: 1000 mL via INTRAVENOUS

## 2018-03-14 MED ORDER — PROPOFOL 10 MG/ML IV BOLUS
INTRAVENOUS | Status: AC
  Filled 2018-03-14: qty 20

## 2018-03-14 MED ORDER — SUGAMMADEX SODIUM 200 MG/2ML IV SOLN
INTRAVENOUS | Status: DC | PRN
  Administered 2018-03-14: 250 mg via INTRAVENOUS

## 2018-03-14 MED ORDER — KETOROLAC TROMETHAMINE 15 MG/ML IJ SOLN
7.5000 mg | Freq: Four times a day (QID) | INTRAMUSCULAR | Status: AC
  Administered 2018-03-14 – 2018-03-15 (×4): 7.5 mg via INTRAVENOUS
  Filled 2018-03-14 (×5): qty 1

## 2018-03-14 MED ORDER — ROCURONIUM BROMIDE 50 MG/5ML IV SOLN
INTRAVENOUS | Status: AC
  Filled 2018-03-14: qty 1

## 2018-03-14 MED ORDER — HYDRALAZINE HCL 20 MG/ML IJ SOLN
INTRAMUSCULAR | Status: DC | PRN
  Administered 2018-03-14: 5 mg via INTRAVENOUS

## 2018-03-14 MED ORDER — TRAMADOL HCL 50 MG PO TABS
50.0000 mg | ORAL_TABLET | Freq: Four times a day (QID) | ORAL | Status: DC | PRN
  Administered 2018-03-14: 50 mg via ORAL
  Filled 2018-03-14: qty 1

## 2018-03-14 MED ORDER — ONDANSETRON HCL 4 MG/2ML IJ SOLN
INTRAMUSCULAR | Status: AC
  Filled 2018-03-14: qty 2

## 2018-03-14 MED ORDER — ACETAMINOPHEN 325 MG PO TABS: 325.0000 mg | ORAL_TABLET | Freq: Four times a day (QID) | ORAL | Status: DC | PRN

## 2018-03-14 MED ORDER — ENOXAPARIN SODIUM 40 MG/0.4ML ~~LOC~~ SOLN
40.0000 mg | SUBCUTANEOUS | Status: DC
  Administered 2018-03-15: 40 mg via SUBCUTANEOUS
  Filled 2018-03-14: qty 0.4

## 2018-03-14 MED ORDER — TRANEXAMIC ACID 1000 MG/10ML IV SOLN
INTRAVENOUS | Status: DC | PRN
  Administered 2018-03-14: 1000 mg via INTRAVENOUS

## 2018-03-14 MED ORDER — HYDRALAZINE HCL 20 MG/ML IJ SOLN
INTRAMUSCULAR | Status: AC
  Filled 2018-03-14: qty 1

## 2018-03-14 MED ORDER — ACETAMINOPHEN 10 MG/ML IV SOLN
INTRAVENOUS | Status: DC | PRN
  Administered 2018-03-14: 1000 mg via INTRAVENOUS

## 2018-03-14 MED ORDER — HYDROMORPHONE HCL 1 MG/ML IJ SOLN
INTRAMUSCULAR | Status: DC | PRN
  Administered 2018-03-14: 1 mg via INTRAVENOUS

## 2018-03-14 MED ORDER — ROCURONIUM BROMIDE 50 MG/5ML IV SOLN
INTRAVENOUS | Status: AC
  Filled 2018-03-14: qty 2

## 2018-03-14 MED ORDER — BISACODYL 10 MG RE SUPP: 10.0000 mg | Freq: Every day | RECTAL | Status: DC | PRN

## 2018-03-14 MED ORDER — LIDOCAINE HCL (PF) 1 % IJ SOLN
INTRAMUSCULAR | Status: AC
  Filled 2018-03-14: qty 5

## 2018-03-14 MED ORDER — MIDAZOLAM HCL 2 MG/2ML IJ SOLN
INTRAMUSCULAR | Status: AC
Start: 2018-03-14 — End: 2018-03-14
  Filled 2018-03-14: qty 2

## 2018-03-14 MED ORDER — BUPIVACAINE-EPINEPHRINE (PF) 0.5% -1:200000 IJ SOLN
INTRAMUSCULAR | Status: AC
  Filled 2018-03-14: qty 30

## 2018-03-14 MED ORDER — ESMOLOL HCL 100 MG/10ML IV SOLN
INTRAVENOUS | Status: DC | PRN
  Administered 2018-03-14: 40 mg via INTRAVENOUS

## 2018-03-14 MED ORDER — NEOMYCIN-POLYMYXIN B GU 40-200000 IR SOLN
Status: DC | PRN
  Administered 2018-03-14: 12 mL
  Administered 2018-03-14: 2 mL

## 2018-03-14 MED ORDER — FENTANYL CITRATE (PF) 100 MCG/2ML IJ SOLN
INTRAMUSCULAR | Status: AC
  Filled 2018-03-14: qty 2

## 2018-03-14 MED ORDER — METOCLOPRAMIDE HCL 10 MG PO TABS: 5.0000 mg | ORAL_TABLET | Freq: Three times a day (TID) | ORAL | Status: DC | PRN

## 2018-03-14 MED ORDER — SODIUM CHLORIDE 0.9 % IJ SOLN
INTRAMUSCULAR | Status: AC
  Filled 2018-03-14: qty 50

## 2018-03-14 MED ORDER — NEOMYCIN-POLYMYXIN B GU 40-200000 IR SOLN
Status: AC
  Filled 2018-03-14: qty 20

## 2018-03-14 MED ORDER — DIPHENHYDRAMINE HCL 50 MG/ML IJ SOLN
INTRAMUSCULAR | Status: AC
  Administered 2018-03-14: 12.5 mg via INTRAVENOUS
  Filled 2018-03-14: qty 1

## 2018-03-14 MED ORDER — DIPHENHYDRAMINE HCL 50 MG/ML IJ SOLN
12.5000 mg | Freq: Once | INTRAMUSCULAR | Status: AC
  Administered 2018-03-14: 12.5 mg via INTRAVENOUS

## 2018-03-14 MED ORDER — ONDANSETRON HCL 4 MG/2ML IJ SOLN
INTRAMUSCULAR | Status: DC | PRN
  Administered 2018-03-14: 4 mg via INTRAVENOUS

## 2018-03-14 MED ORDER — BUPIVACAINE HCL (PF) 0.5 % IJ SOLN
INTRAMUSCULAR | Status: AC
  Filled 2018-03-14: qty 10

## 2018-03-14 MED ORDER — FLEET ENEMA 7-19 GM/118ML RE ENEM: 1.0000 | ENEMA | Freq: Once | RECTAL | Status: DC | PRN

## 2018-03-14 MED ORDER — BUPIVACAINE HCL (PF) 0.5 % IJ SOLN
INTRAMUSCULAR | Status: DC | PRN
  Administered 2018-03-14: 10 mL via PERINEURAL

## 2018-03-14 MED ORDER — MORPHINE SULFATE (PF) 2 MG/ML IV SOLN: 1.0000 mg | INTRAVENOUS | Status: DC | PRN

## 2018-03-14 MED ORDER — CEFAZOLIN SODIUM-DEXTROSE 2-3 GM-%(50ML) IV SOLR
INTRAVENOUS | Status: AC
  Filled 2018-03-14: qty 50

## 2018-03-14 MED ORDER — DOCUSATE SODIUM 100 MG PO CAPS
100.0000 mg | ORAL_CAPSULE | Freq: Two times a day (BID) | ORAL | Status: DC
  Administered 2018-03-14 – 2018-03-15 (×2): 100 mg via ORAL
  Filled 2018-03-14 (×2): qty 1

## 2018-03-14 MED ORDER — CEFAZOLIN SODIUM-DEXTROSE 2-4 GM/100ML-% IV SOLN
2.0000 g | Freq: Once | INTRAVENOUS | Status: AC
  Administered 2018-03-14: 2 g via INTRAVENOUS
  Filled 2018-03-14: qty 100

## 2018-03-14 MED ORDER — INSULIN GLARGINE 100 UNIT/ML ~~LOC~~ SOLN
24.0000 [IU] | Freq: Every day | SUBCUTANEOUS | Status: DC
Start: 2018-03-14 — End: 2018-03-15
  Filled 2018-03-14 (×2): qty 0.24

## 2018-03-14 MED ORDER — ROCURONIUM BROMIDE 100 MG/10ML IV SOLN
INTRAVENOUS | Status: DC | PRN
  Administered 2018-03-14: 80 mg via INTRAVENOUS
  Administered 2018-03-14: 20 mg via INTRAVENOUS

## 2018-03-14 MED ORDER — LIDOCAINE HCL (CARDIAC) 20 MG/ML IV SOLN
INTRAVENOUS | Status: DC | PRN
  Administered 2018-03-14: 100 mg via INTRAVENOUS

## 2018-03-14 MED ORDER — FENTANYL CITRATE (PF) 100 MCG/2ML IJ SOLN
INTRAMUSCULAR | Status: DC | PRN
  Administered 2018-03-14: 75 ug via INTRAVENOUS
  Administered 2018-03-14: 25 ug via INTRAVENOUS
  Administered 2018-03-14: 100 ug via INTRAVENOUS
  Administered 2018-03-14: 50 ug via INTRAVENOUS

## 2018-03-14 MED ORDER — MAGNESIUM HYDROXIDE 400 MG/5ML PO SUSP: 30.0000 mL | Freq: Every day | ORAL | Status: DC | PRN

## 2018-03-14 MED ORDER — PHENYLEPHRINE HCL 10 MG/ML IJ SOLN
INTRAMUSCULAR | Status: DC | PRN
  Administered 2018-03-14: 30 ug/min via INTRAVENOUS

## 2018-03-14 MED ORDER — PANTOPRAZOLE SODIUM 40 MG PO TBEC
40.0000 mg | DELAYED_RELEASE_TABLET | Freq: Every day | ORAL | Status: DC
  Administered 2018-03-15: 40 mg via ORAL
  Filled 2018-03-14: qty 1

## 2018-03-14 MED ORDER — BUPIVACAINE LIPOSOME 1.3 % IJ SUSP
INTRAMUSCULAR | Status: DC | PRN
  Administered 2018-03-14: 20 mL via PERINEURAL

## 2018-03-14 MED ORDER — PROPOFOL 10 MG/ML IV BOLUS
INTRAVENOUS | Status: DC | PRN
  Administered 2018-03-14: 20 mg via INTRAVENOUS
  Administered 2018-03-14: 50 mg via INTRAVENOUS
  Administered 2018-03-14: 180 mg via INTRAVENOUS

## 2018-03-14 MED ORDER — FAMOTIDINE 20 MG PO TABS
20.0000 mg | ORAL_TABLET | Freq: Once | ORAL | Status: AC
  Administered 2018-03-14: 20 mg via ORAL

## 2018-03-14 MED ORDER — LIDOCAINE HCL (PF) 2 % IJ SOLN
INTRAMUSCULAR | Status: AC
  Filled 2018-03-14: qty 10

## 2018-03-14 MED ORDER — CEFAZOLIN SODIUM-DEXTROSE 2-4 GM/100ML-% IV SOLN
2.0000 g | Freq: Four times a day (QID) | INTRAVENOUS | Status: AC
  Administered 2018-03-14 – 2018-03-15 (×3): 2 g via INTRAVENOUS
  Filled 2018-03-14 (×3): qty 100

## 2018-03-14 MED ORDER — METOCLOPRAMIDE HCL 5 MG/ML IJ SOLN: 5.0000 mg | Freq: Three times a day (TID) | INTRAMUSCULAR | Status: DC | PRN

## 2018-03-14 MED ORDER — INSULIN ASPART 100 UNIT/ML ~~LOC~~ SOLN
0.0000 [IU] | Freq: Three times a day (TID) | SUBCUTANEOUS | Status: DC
  Administered 2018-03-14: 5 [IU] via SUBCUTANEOUS
  Administered 2018-03-15: 3 [IU] via SUBCUTANEOUS
  Filled 2018-03-14 (×2): qty 1

## 2018-03-14 MED ORDER — LIDOCAINE HCL (PF) 1 % IJ SOLN
INTRAMUSCULAR | Status: DC | PRN
  Administered 2018-03-14: 5 mL via SUBCUTANEOUS

## 2018-03-14 MED ORDER — BUPIVACAINE-EPINEPHRINE (PF) 0.5% -1:200000 IJ SOLN
INTRAMUSCULAR | Status: DC | PRN
  Administered 2018-03-14: 30 mL via PERINEURAL

## 2018-03-14 MED ORDER — HYDROMORPHONE HCL 1 MG/ML IJ SOLN
INTRAMUSCULAR | Status: AC
  Filled 2018-03-14: qty 1

## 2018-03-14 MED ORDER — FENTANYL CITRATE (PF) 100 MCG/2ML IJ SOLN
50.0000 ug | Freq: Once | INTRAMUSCULAR | Status: AC
  Administered 2018-03-14: 50 ug via INTRAVENOUS

## 2018-03-14 SURGICAL SUPPLY — 65 items
BASEPLATE GLENOSPHERE 25 (Plate) ×2 IMPLANT
BASEPLATE GLENOSPHERE 25MM (Plate) ×1 IMPLANT
BEARING HUMERAL STRL 44-41 (Orthopedic Implant) ×2 IMPLANT
BEARING HUMERAL STRL 44-41MM (Orthopedic Implant) ×1 IMPLANT
BIT DRILL TWIST 2.7 (BIT) ×2 IMPLANT
BIT DRILL TWIST 2.7MM (BIT) ×1
BLADE SAGITTAL WIDE XTHICK NO (BLADE) ×3 IMPLANT
CANISTER SUCT 1200ML W/VALVE (MISCELLANEOUS) ×3 IMPLANT
CANISTER SUCT 3000ML PPV (MISCELLANEOUS) ×6 IMPLANT
CHLORAPREP W/TINT 26ML (MISCELLANEOUS) ×3 IMPLANT
COOLER POLAR GLACIER W/PUMP (MISCELLANEOUS) ×3 IMPLANT
CRADLE LAMINECT ARM (MISCELLANEOUS) ×3 IMPLANT
DRAPE IMP U-DRAPE 54X76 (DRAPES) ×6 IMPLANT
DRAPE INCISE IOBAN 66X45 STRL (DRAPES) ×6 IMPLANT
DRAPE INCISE IOBAN 66X60 STRL (DRAPES) ×3 IMPLANT
DRAPE SHEET LG 3/4 BI-LAMINATE (DRAPES) ×6 IMPLANT
DRAPE TABLE BACK 80X90 (DRAPES) ×3 IMPLANT
DRSG OPSITE POSTOP 4X8 (GAUZE/BANDAGES/DRESSINGS) ×3 IMPLANT
ELECT BLADE 6.5 EXT (BLADE) ×3 IMPLANT
ELECT CAUTERY BLADE 6.4 (BLADE) ×3 IMPLANT
GLENOID SPHERE MED 3INX5IN (Orthopedic Implant) ×3 IMPLANT
GLOVE BIO SURGEON STRL SZ7.5 (GLOVE) ×12 IMPLANT
GLOVE BIO SURGEON STRL SZ8 (GLOVE) ×12 IMPLANT
GLOVE BIOGEL PI IND STRL 8 (GLOVE) ×1 IMPLANT
GLOVE BIOGEL PI INDICATOR 8 (GLOVE) ×2
GLOVE INDICATOR 8.0 STRL GRN (GLOVE) ×3 IMPLANT
GOWN STRL REUS W/ TWL LRG LVL3 (GOWN DISPOSABLE) ×1 IMPLANT
GOWN STRL REUS W/ TWL XL LVL3 (GOWN DISPOSABLE) ×1 IMPLANT
GOWN STRL REUS W/TWL LRG LVL3 (GOWN DISPOSABLE) ×2
GOWN STRL REUS W/TWL XL LVL3 (GOWN DISPOSABLE) ×2
HOOD PEEL AWAY FLYTE STAYCOOL (MISCELLANEOUS) ×9 IMPLANT
KIT STABILIZATION SHOULDER (MISCELLANEOUS) ×3 IMPLANT
KIT TURNOVER KIT A (KITS) ×3 IMPLANT
MASK FACE SPIDER DISP (MASK) ×3 IMPLANT
NDL SAFETY ECLIPSE 18X1.5 (NEEDLE) ×1 IMPLANT
NEEDLE HYPO 18GX1.5 SHARP (NEEDLE) ×2
NEEDLE HYPO 22GX1.5 SAFETY (NEEDLE) ×3 IMPLANT
NEEDLE SPNL 20GX3.5 QUINCKE YW (NEEDLE) ×3 IMPLANT
NS IRRIG 500ML POUR BTL (IV SOLUTION) ×3 IMPLANT
PACK ARTHROSCOPY SHOULDER (MISCELLANEOUS) ×3 IMPLANT
PAD WRAPON POLAR SHDR UNIV (MISCELLANEOUS) ×1 IMPLANT
PIN THREADED REVERSE (PIN) ×3 IMPLANT
PULSAVAC PLUS IRRIG FAN TIP (DISPOSABLE) ×3
SCREW BONE LOCKING 4.75X30X3.5 (Screw) ×6 IMPLANT
SCREW BONE STRL 6.5MMX30MM (Screw) ×3 IMPLANT
SCREW NON-LOCK 4.75MMX15MM (Screw) ×3 IMPLANT
SCREW NON-LOCK 4.75X20X3.5 (Screw) ×3 IMPLANT
SLING ULTRA II M (MISCELLANEOUS) ×3 IMPLANT
SOL .9 NS 3000ML IRR  AL (IV SOLUTION) ×2
SOL .9 NS 3000ML IRR UROMATIC (IV SOLUTION) ×1 IMPLANT
SPONGE LAP 18X18 5 PK (GAUZE/BANDAGES/DRESSINGS) ×3 IMPLANT
STAPLER SKIN PROX 35W (STAPLE) ×3 IMPLANT
STEM SHOULDER 16MMX55MM LONG (Stem) ×3 IMPLANT
SUT ETHIBOND 0 MO6 C/R (SUTURE) ×3 IMPLANT
SUT FIBERWIRE #2 38 BLUE 1/2 (SUTURE) ×12
SUT VIC AB 0 CT1 36 (SUTURE) ×6 IMPLANT
SUT VIC AB 2-0 CT1 27 (SUTURE) ×4
SUT VIC AB 2-0 CT1 TAPERPNT 27 (SUTURE) ×2 IMPLANT
SUTURE FIBERWR #2 38 BLUE 1/2 (SUTURE) ×4 IMPLANT
SYR 10ML LL (SYRINGE) ×3 IMPLANT
SYR 30ML LL (SYRINGE) ×6 IMPLANT
TIP FAN IRRIG PULSAVAC PLUS (DISPOSABLE) ×1 IMPLANT
TRAY FOLEY W/METER SILVER 16FR (SET/KITS/TRAYS/PACK) ×3 IMPLANT
TRAY HUM STD 44MM (Orthopedic Implant) ×3 IMPLANT
WRAPON POLAR PAD SHDR UNIV (MISCELLANEOUS) ×3

## 2018-03-14 NOTE — Anesthesia Postprocedure Evaluation (Signed)
Anesthesia Post Note  Patient: Jay Brown  Procedure(s) Performed: REVERSE SHOULDER ARTHROPLASTY (Left Shoulder)  Patient location during evaluation: PACU Anesthesia Type: General Level of consciousness: awake and alert Pain management: pain level controlled Vital Signs Assessment: post-procedure vital signs reviewed and stable Respiratory status: spontaneous breathing, nonlabored ventilation, respiratory function stable and patient connected to nasal cannula oxygen Cardiovascular status: blood pressure returned to baseline and stable Postop Assessment: no apparent nausea or vomiting Anesthetic complications: no     Last Vitals:  Vitals:   03/14/18 1440 03/14/18 1546  BP: (!) 141/70 (!) 143/72  Pulse: 63 63  Resp:    Temp: 36.5 C   SpO2: 99% 99%    Last Pain:  Vitals:   03/14/18 1628  TempSrc:   PainSc: 4                  Martha Clan

## 2018-03-14 NOTE — Anesthesia Post-op Follow-up Note (Signed)
Anesthesia QCDR form completed.        

## 2018-03-14 NOTE — Evaluation (Signed)
Physical Therapy Evaluation Patient Details Name: Jay Brown MRN: [id number] DOB: 10-02-1941 Today's Date: 03/14/2018   History of Present Illness  Patient is a 77 year old male admitted from home s/p L reverse shoulder replacement.  PMH includes skin CA, osteoporosis, osteomyelitis, Htn, HLD, hx of kidney stones, DM, adenatomous polyps and cervical DJD.  Clinical Impression  Pt is a 77 year old male who lives in a one story home with his wife.  Pt is independent without use of AD at baseline.  He is in bed upon PT arrival and reports no pain.  PT and OT assisted pt in adjustment of shoulder sling and Polar Care, educating pt concerning proper use.  Pt presented with WNL strength of BLE and RUE and good grip strength of L UE.  Pt was able to perform bed mobility without physical assist following education concerning sequencing.  Pt able to perform STS CGA with use of SPC.  Pt presented with no sx of hypotension upon standing or following ambulation.  Pt was able to walk 30 ft in rm with SPC, demonstrating good gait mechanics.  PT provided min VC's concerning management of SPC and sitting in chair.  Pt will continue to benefit from skilled PT for strength,  Shoulder ROM, functional mobility and use of AD.    Follow Up Recommendations Home health PT    Equipment Recommendations  Cane    Recommendations for Other Services       Precautions / Restrictions Precautions Precautions: Fall Restrictions Weight Bearing Restrictions: No      Mobility  Bed Mobility Overal bed mobility: Modified Independent;Needs Assistance Bed Mobility: Supine to Sit     Supine to sit: Min guard     General bed mobility comments: PT provided education concerning sequencing when exiting bed to R side.  Pt was able to perform without physical assist.  Transfers Overall transfer level: Needs assistance Equipment used: Straight cane Transfers: Sit to/from Stand Sit to Stand: Min guard          General transfer comment: Able to initiate and complete STS without physical assist.  Ambulation/Gait Ambulation/Gait assistance: Supervision Ambulation Distance (Feet): 30 Feet Assistive device: Straight cane     Gait velocity interpretation: 1.31 - 2.62 ft/sec, indicative of limited community ambulator General Gait Details: Able to ambulate with education for proper use of SPC, normal foot clearance and step length.  Pt did not appear or report to be symptomatic of hypotension when standing upright.  Stairs            Wheelchair Mobility    Modified Rankin (Stroke Patients Only)       Balance Overall balance assessment: Modified Independent                                           Pertinent Vitals/Pain Pain Assessment: No/denies pain    Home Living Family/patient expects to be discharged to:: Private residence Living Arrangements: Spouse/significant other Available Help at Discharge: Family;Available 24 hours/day Type of Home: House Home Access: Stairs to enter Entrance Stairs-Rails: None Entrance Stairs-Number of Steps: 1 Home Layout: One level Home Equipment: None      Prior Function Level of Independence: Independent         Comments: Pt able to drive and ambulate in community without AD.     Hand Dominance   Dominant Hand: Right  Extremity/Trunk Assessment   Upper Extremity Assessment Upper Extremity Assessment: Overall WFL for tasks assessed;LUE deficits/detail LUE: Unable to fully assess due to immobilization LUE Sensation: WNL    Lower Extremity Assessment Lower Extremity Assessment: Overall WFL for tasks assessed    Cervical / Trunk Assessment Cervical / Trunk Assessment: Normal  Communication   Communication: No difficulties  Cognition Arousal/Alertness: Awake/alert Behavior During Therapy: WFL for tasks assessed/performed Overall Cognitive Status: Within Functional Limits for tasks assessed                                         General Comments      Exercises Other Exercises Other Exercises: Educated pt concerning polar care and importance of barrier between ice and skin.  Assisted pt in adjusting polar care and shoulder sling.   Assessment/Plan    PT Assessment Patient needs continued PT services  PT Problem List Decreased strength;Decreased mobility;Decreased range of motion;Decreased coordination;Decreased knowledge of precautions;Decreased activity tolerance;Decreased balance;Decreased knowledge of use of DME;Impaired sensation;Pain       PT Treatment Interventions DME instruction;Therapeutic activities;Gait training;Therapeutic exercise;Stair training;Balance training;Functional mobility training;Neuromuscular re-education;Patient/family education    PT Goals (Current goals can be found in the Care Plan section)  Acute Rehab PT Goals Patient Stated Goal: To return home and back to general activity. PT Goal Formulation: With patient Time For Goal Achievement: 03/28/18 Potential to Achieve Goals: Good    Frequency BID   Barriers to discharge        Co-evaluation               AM-PAC PT "6 Clicks" Daily Activity  Outcome Measure Difficulty turning over in bed (including adjusting bedclothes, sheets and blankets)?: A Little Difficulty moving from lying on back to sitting on the side of the bed? : A Little Difficulty sitting down on and standing up from a chair with arms (e.g., wheelchair, bedside commode, etc,.)?: A Little Help needed moving to and from a bed to chair (including a wheelchair)?: A Little Help needed walking in hospital room?: A Little Help needed climbing 3-5 steps with a railing? : A Little 6 Click Score: 18    End of Session Equipment Utilized During Treatment: Gait belt Activity Tolerance: Patient tolerated treatment well Patient left: in chair;with chair alarm set;with call bell/phone within reach   PT Visit Diagnosis:  Unsteadiness on feet (R26.81);Muscle weakness (generalized) (M62.81);Pain Pain - Right/Left: Left Pain - part of body: Shoulder    Time: 1430-1500 PT Time Calculation (min) (ACUTE ONLY): 30 min   Charges:   PT Evaluation $PT Eval Low Complexity: 1 Low PT Treatments $Therapeutic Activity: 8-22 mins   PT G Codes:   PT G-Codes **NOT FOR INPATIENT CLASS** Functional Assessment Tool Used: AM-PAC 6 Clicks Basic Mobility    Roxanne Gates, PT, DPT   Roxanne Gates 03/14/2018, 3:33 PM

## 2018-03-14 NOTE — H&P (Signed)
Paper H&P to be scanned into permanent record. H&P reviewed and patient re-examined. No changes. 

## 2018-03-14 NOTE — Transfer of Care (Signed)
Immediate Anesthesia Transfer of Care Note  Patient: Jay Brown  Procedure(s) Performed: REVERSE SHOULDER ARTHROPLASTY (Left Shoulder)  Patient Location: PACU  Anesthesia Type:General  Level of Consciousness: awake and patient cooperative  Airway & Oxygen Therapy: Patient Spontanous Breathing and Patient connected to nasal cannula oxygen  Post-op Assessment: Report given to RN and Post -op Vital signs reviewed and stable  Post vital signs: Reviewed and stable  Last Vitals:  Vitals Value Taken Time  BP    Temp    Pulse    Resp    SpO2      Last Pain:  Vitals:   03/14/18 0730  TempSrc:   PainSc: 0-No pain         Complications: No apparent anesthesia complications

## 2018-03-14 NOTE — Anesthesia Preprocedure Evaluation (Signed)
Anesthesia Evaluation  Patient identified by MRN, date of birth, ID band Patient awake    Reviewed: Allergy & Precautions, NPO status , Patient's Chart, lab work & pertinent test results  History of Anesthesia Complications (+) PONV and history of anesthetic complications  Airway Mallampati: II  TM Distance: >3 FB Neck ROM: Full    Dental  (+) Poor Dentition   Pulmonary neg sleep apnea, neg COPD, former smoker,    breath sounds clear to auscultation- rhonchi (-) wheezing      Cardiovascular Exercise Tolerance: Good hypertension, Pt. on medications (-) angina(-) CAD, (-) Past MI, (-) Cardiac Stents and (-) CABG (-) dysrhythmias (-) Valvular Problems/Murmurs Rhythm:Regular Rate:Normal - Systolic murmurs and - Diastolic murmurs    Neuro/Psych negative neurological ROS  negative psych ROS   GI/Hepatic negative GI ROS, Neg liver ROS,   Endo/Other  diabetes, Insulin Dependent  Renal/GU negative Renal ROS     Musculoskeletal  (+) Arthritis ,   Abdominal (+) + obese,   Peds  Hematology negative hematology ROS (+)   Anesthesia Other Findings Past Medical History: No date: Adenomatous polyps No date: Degenerative disc disease, cervical     Comment: C5,6,7 No date: Diabetes (HCC) No date: History of kidney stones No date: Hyperlipidemia No date: Hypertension No date: Osteomyelitis (HCC)     Comment: right 3rd toe No date: Osteoporosis No date: PONV (postoperative nausea and vomiting) No date: Sepsis ([place]) No date: Skin cancer     Comment: bilateral arms and face   Reproductive/Obstetrics                             Anesthesia Physical  Anesthesia Plan  ASA: III  Anesthesia Plan: General   Post-op Pain Management:    Induction: Intravenous  PONV Risk Score and Plan: 3 and Ondansetron and Dexamethasone  Airway Management Planned: Oral ETT  Additional Equipment:   Intra-op  Plan:   Post-operative Plan: Extubation in OR  Informed Consent: I have reviewed the patients History and Physical, chart, labs and discussed the procedure including the risks, benefits and alternatives for the proposed anesthesia with the patient or authorized representative who has indicated his/her understanding and acceptance.   Dental advisory given  Plan Discussed with: CRNA and Anesthesiologist  Anesthesia Plan Comments: (Blood sugar elevated this AM, only took 1/2 of normal dose last evening. Will cover with short-acting insulin and recheck.)        Anesthesia Quick Evaluation

## 2018-03-14 NOTE — Clinical Social Work Note (Signed)
CSW received referral for SNF.  Case discussed with case manager and plan is to discharge home with home health.  CSW to sign off please re-consult if social work needs arise.  Megin Consalvo R. Ulyssa Walthour, MSW, LCSWA [phone]  

## 2018-03-14 NOTE — Anesthesia Procedure Notes (Signed)
Anesthesia Regional Block: Interscalene brachial plexus block   Pre-Anesthetic Checklist: ,, timeout performed, Correct Patient, Correct Site, Correct Laterality, Correct Procedure, Correct Position, site marked, Risks and benefits discussed,  Surgical consent,  Pre-op evaluation,  At surgeon's request and post-op pain management  Laterality: Left and Upper  Prep: chloraprep       Needles:  Injection technique: Single-shot  Needle Type: Stimiplex     Needle Length: 9cm  Needle Gauge: 22     Additional Needles:   Procedures:,,,, ultrasound used (permanent image in chart),,,,  Narrative:  Start time: 03/14/2018 7:31 AM End time: 03/14/2018 7:34 AM Injection made incrementally with aspirations every 5 mL.  Performed by: Personally  Anesthesiologist: Martha Clan, MD  Additional Notes: Functioning IV was confirmed and monitors were applied.  A 6mm 22ga Stimuplex needle was used. Sterile prep and drape,hand hygiene and sterile gloves were used.  Negative aspiration and negative test dose prior to incremental administration of local anesthetic. The patient tolerated the procedure well.

## 2018-03-14 NOTE — Anesthesia Procedure Notes (Signed)
Procedure Name: Intubation Date/Time: 03/14/2018 7:48 AM Performed by: Bernardo Heater, CRNA Pre-anesthesia Checklist: Patient identified, Emergency Drugs available, Suction available and Patient being monitored Patient Re-evaluated:Patient Re-evaluated prior to induction Oxygen Delivery Method: Circle system utilized Preoxygenation: Pre-oxygenation with 100% oxygen Induction Type: IV induction Laryngoscope Size: Mac and 3 Grade View: Grade I Tube size: 7.0 mm Number of attempts: 1 Placement Confirmation: ETT inserted through vocal cords under direct vision,  positive ETCO2 and breath sounds checked- equal and bilateral Secured at: 23 cm Tube secured with: Tape Dental Injury: Teeth and Oropharynx as per pre-operative assessment

## 2018-03-14 NOTE — Op Note (Signed)
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  Post-Op Diagnosis:   Same  Procedure:   Reverse left total shoulder arthroplasty.  Surgeon:   Pascal Lux, MD  Assistant:   Arvilla Meres, RNFA  Anesthesia:   General endotracheal with an Exparel interscalene block placed preoperatively by the anesthesiologist.  Findings:   As above.  Complications:   None  EBL:  200 cc  Fluids:   900 cc crystalloid  UOP:   None  TT:   None  Drains:   None  Closure:   Staples  Implants:   All press-fit Biomet Comprehensive system with a #16 micro-humeral stem, a 44 mm humeral tray with a standard insert, and a mini-base plate with a 41 mm glenosphere.  Brief Clinical Note:   The patient is a 77 year old male with a long history of left shoulder pain. His symptoms have persisted despite medications, activity modification, injections, and attempted rotator cuff repairs x2. His history and examination are consistent with an irreparable rotator cuff tear with cuff arthropathy, all of which were confirmed by MRI scan. The patient presents at this time for a reverse left total shoulder arthroplasty.  Procedure:   The patient underwent placement of an interscalene block using Exparel by the anesthesiologist in the preoperative holding area before being brought into the operating room and lain in the supine position. The patient then underwent general endotracheal intubation and anesthesia before the patient was repositioned in the beach chair position using the beach chair positioner. The left shoulder and upper extremity were prepped with ChloraPrep solution before being draped sterilely. Preoperative antibiotics were administered. A standard anterior approach to the shoulder was made through an approximately 4-5 inch incision. The incision was carried down through the subcutaneous tissues to expose  the deltopectoral fascia. The interval between the deltoid and pectoralis muscles was identified and this plane developed, retracting the cephalic vein laterally with the deltoid muscle. The conjoined tendon was identified. Its lateral margin was dissected and the Kolbel self-retraining retractor inserted. The "three sisters" were identified and cauterized. Bursal tissues were removed to improve visualization. The subscapularis tendon was released from its attachment to the lesser tuberosity 1 cm proximal to its insertion and several tagging sutures placed. The inferior capsule was released with care after identifying and protecting the axillary nerve. The biceps tendon was identified near the inferior end of the bicipital groove and a soft tissue tenodesis performed, attaching the biceps tendon to the adjacent pectoralis major tendon utilizing two #0 Ethibond interrupted sutures. The proximal humeral cut was made at approximately 20 of retroversion using the extra-medullary guide.   Attention was redirected to the glenoid. The labrum was debrided circumferentially before the center of the glenoid was marked with electrocautery. The guidewire was drilled into the glenoid neck using the appropriate guide. After verifying its position, it was overreamed with the mini-baseplate reamer to create a flat surface. The permanent mini-baseplate was impacted into place. It was stabilized with a 30 x 6.5 mm central screw and four peripheral screws. Locking screws were placed superiorly and inferiorly while nonlocking screws were placed anteriorly and posteriorly. The permanent 36 mm glenosphere was then impacted into place and its Morse taper locking mechanism verified using manual distraction.  Attention was directed to the humeral side. The humeral canal was reamed sequentially beginning with the end-cutting reamer then progressing from a 4 mm reamer  up to a 16 mm reamer. This provided excellent circumferential chatter.  The canal was broached beginning with a #13 broach and progressing to a #16 broach. This was left in place and a trial reduction performed using the standard trial humeral platform. The arm demonstrated excellent range of motion as the hand could be brought across the chest to the opposite shoulder and brought to the top of the patient's head and to the patient's ear. The shoulder appeared stable throughout this range of motion. The joint was dislocated and the trial components removed. The permanent #16 micro-stem was impacted into place with care taken to maintain the appropriate version. The permanent 44 mm humeral platform with the standard insert was put together on the back table and impacted into place. Again, the [facility] taper locking mechanism was verified using manual distraction. The shoulder was relocated using two finger pressure and again placed through a range of motion with the findings as described above.  The wound was copiously irrigated with bacitracin saline solution using the jet lavage system before a total of 30 cc of 0.5% Sensorcaine with epinephrine was injected into the pericapsular and peri-incisional tissues to help with postoperative analgesia. The subscapularis tendon was reapproximated using #2 FiberWire interrupted sutures. The deltopectoral interval was closed using #0 Vicryl interrupted sutures before the subcutaneous tissues were closed using 2-0 Vicryl interrupted sutures. The skin was closed using staples. Prior to closing the skin, 1 g of transexemic acid in 10 cc of normal saline was injected intra-articularly to help with postoperative bleeding. A sterile occlusive dressing was applied to the wound before the arm was placed into a shoulder immobilizer with an abduction pillow. A Polar Care system also was applied to the shoulder. The patient was then transferred back to a hospital bed before being awakened, extubated, and returned to the recovery room in satisfactory  condition after tolerating the procedure well.

## 2018-03-15 ENCOUNTER — Encounter: Payer: Self-pay | Admitting: Surgery

## 2018-03-15 LAB — BASIC METABOLIC PANEL
Anion gap: 3 — ABNORMAL LOW (ref 5–15)
BUN: 19 mg/dL (ref 6–20)
CALCIUM: 8.4 mg/dL — AB (ref 8.9–10.3)
CO2: 24 mmol/L (ref 22–32)
CREATININE: 1.05 mg/dL (ref 0.61–1.24)
Chloride: 109 mmol/L (ref 101–111)
GFR calc Af Amer: >60 mL/min (ref >60)
GFR calc non Af Amer: >60 mL/min (ref >60)
GLUCOSE: 169 mg/dL — AB (ref 65–99)
Potassium: 4 mmol/L (ref 3.5–5.1)
Sodium: 136 mmol/L (ref 135–145)

## 2018-03-15 LAB — CBC WITH DIFFERENTIAL/PLATELET
BASOS PCT: 1 %
Basophils Absolute: 0.1 10*3/uL (ref 0–0.1)
Eosinophils Absolute: 0.2 10*3/uL (ref 0–0.7)
Eosinophils Relative: 2 %
HEMATOCRIT: 37.5 % — AB (ref 40.0–52.0)
Hemoglobin: 12.7 g/dL — ABNORMAL LOW (ref 13.0–18.0)
LYMPHS ABS: 1.9 10*3/uL (ref 1.0–3.6)
Lymphocytes Relative: 17 %
MCH: 29.8 pg (ref 26.0–34.0)
MCHC: 33.9 g/dL (ref 32.0–36.0)
MCV: 87.9 fL (ref 80.0–100.0)
MONO ABS: 1.1 10*3/uL — AB (ref 0.2–1.0)
MONOS PCT: 10 %
NEUTROS ABS: 7.6 10*3/uL — AB (ref 1.4–6.5)
Neutrophils Relative %: 70 %
Platelets: 223 10*3/uL (ref 150–440)
RBC: 4.26 MIL/uL — ABNORMAL LOW (ref 4.40–5.90)
RDW: 13.2 % (ref 11.5–14.5)
WBC: 10.8 10*3/uL — ABNORMAL HIGH (ref 3.8–10.6)

## 2018-03-15 LAB — GLUCOSE, CAPILLARY: GLUCOSE-CAPILLARY: 163 mg/dL — AB (ref 65–99)

## 2018-03-15 MED ORDER — HYDROCODONE-ACETAMINOPHEN 5-325 MG PO TABS: 1.0000 | ORAL_TABLET | ORAL | 0 refills | Status: AC | PRN

## 2018-03-15 MED ORDER — ASPIRIN EC 325 MG PO TBEC: 325.0000 mg | DELAYED_RELEASE_TABLET | Freq: Every day | ORAL | 0 refills | Status: AC

## 2018-03-15 MED ORDER — ENOXAPARIN SODIUM 40 MG/0.4ML ~~LOC~~ SOLN
40.0000 mg | SUBCUTANEOUS | 0 refills | Status: DC
Start: 2018-03-15 — End: 2018-03-15

## 2018-03-15 NOTE — Discharge Summary (Addendum)
Physician Discharge Summary  Patient ID: Jay Brown MRN: [id number] DOB/AGE: Apr 20, 1941 77 y.o.  Admit date: 03/14/2018 Discharge date: 03/15/2018  Admission Diagnoses:  Status post reverse total shoulder replacement, left [Z96.612]   Discharge Diagnoses: Patient Active Problem List   Diagnosis Date Noted  . 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 Hyperlipidemia associated with type 2 diabetes mellitus ([place]) 01/20/2017  . Weakness of hand 01/20/2017  . Deformity of nail bed 12/14/2016  . Pain in shoulder 11/19/2015  . Chronic diarrhea 10/15/2015  . Uncontrolled diabetes mellitus type 2 without complications ([place]) 90/30/0923  . Hypertension 08/25/2015    Past Medical History:  Diagnosis Date  . Adenomatous polyps   . Degenerative disc disease, cervical    C5,6,7  . Diabetes ([place])   . History of kidney stones   . Hyperlipidemia   . Hypertension    no meds now  . Osteomyelitis ([facility])    right 3rd toe  . Osteoporosis   . PONV (postoperative nausea and vomiting)   . Sepsis ([place])   . Skin cancer    bilateral arms and face     Transfusion: none   Consultants (if any):   Discharged Condition: Improved  Hospital Course: Jay Brown is an 77 y.o. male who was admitted 03/14/2018 with a diagnosis of left shoulder glenohumeral osteoarthritis and went to the operating room on 03/14/2018 and underwent the above named procedures.    Surgeries: Procedure(s): REVERSE SHOULDER ARTHROPLASTY on 03/14/2018 Patient tolerated the surgery well. Taken to PACU where she was stabilized and then transferred to the orthopedic floor.  Started on Lovenox 40 mg q 24 hrs.  No evidence of DVT. Negative [place]. Physical therapy and OT started day #1 for ADL and assisted devices.  Patient was able to ambulate well with no difficulties.  Patient's foley and IV was discontinued on postop day 1  On post op day #1 patient was stable and ready for discharge to  home.  Implants: All press-fit [employer] Comprehensive system with a #16 micro-humeral stem, a 44 mm humeral tray with a standard insert, and a mini-base plate with a 41 mm glenosphere    He was given perioperative antibiotics:  Anti-infectives (From admission, onward)   Start     Dose/Rate Route Frequency Ordered Stop   03/14/18 1400  ceFAZolin (ANCEF) IVPB 2g/100 mL premix     2 g 200 mL/hr over 30 Minutes Intravenous Every 6 hours 03/14/18 1301 03/15/18 0302   03/14/18 0552  ceFAZolin (ANCEF) 2-3 GM-%(50ML) IVPB SOLR    Note to Pharmacy:  Larita Fife   : cabinet override      03/14/18 0552 03/14/18 1814   03/14/18 0530  ceFAZolin (ANCEF) IVPB 2g/100 mL premix     2 g 200 mL/hr over 30 Minutes Intravenous  Once 03/14/18 0524 03/14/18 0817    .  He was given sequential compression devices, early ambulation, and Lovenox for DVT prophylaxis.  He benefited maximally from the hospital stay and there were no complications.    Recent vital signs:  Vitals:   03/14/18 2324 03/15/18 0801  BP: 129/75 126/69  Pulse: 68 74  Resp: 18 18  Temp: 98.5 F (36.9 C) 98.2 F (36.8 C)  SpO2: 95% 95%    Recent laboratory studies:  Lab Results  Component Value Date   HGB 12.7 (L) 03/15/2018   HGB 14.0 03/06/2018   HGB 14.0 01/25/2017   Lab Results  Component Value Date   WBC  10.8 (H) 03/15/2018   PLT 223 03/15/2018   Lab Results  Component Value Date   INR 0.92 03/06/2018   Lab Results  Component Value Date   NA 136 03/15/2018   K 4.0 03/15/2018   CL 109 03/15/2018   CO2 24 03/15/2018   BUN 19 03/15/2018   CREATININE 1.05 03/15/2018   GLUCOSE 169 (H) 03/15/2018    Discharge Medications:   Allergies as of 03/15/2018      Reactions   Oxycodone Itching      Medication List    TAKE these medications   acetaminophen 500 MG tablet Commonly known as:  TYLENOL Take 1,000 mg by mouth every 8 (eight) hours as needed.   amLODipine 2.5 MG tablet Commonly known as:   NORVASC Take 1 tablet (2.5 mg total) by mouth daily.   aspirin EC 325 MG tablet Take 1 tablet (325 mg total) by mouth daily.   atorvastatin 10 MG tablet Commonly known as:  LIPITOR Take 1 tablet (10 mg total) by mouth at bedtime.   Dulaglutide 0.75 MG/0.5ML Sopn Commonly known as:  TRULICITY Inject 2.95 mg into the skin once a week.   HYDROcodone-acetaminophen 5-325 MG tablet Commonly known as:  NORCO/VICODIN Take 1-2 tablets by mouth every 4 (four) hours as needed for moderate pain (pain score 4-6).   Insulin Glargine 300 UNIT/ML Sopn Commonly known as:  TOUJEO SOLOSTAR Inject 46 Units into the skin daily after supper. What changed:    how much to take  when to take this   lisinopril 40 MG tablet Commonly known as:  PRINIVIL,ZESTRIL Take 1 tablet (40 mg total) by mouth daily.       Diagnostic Studies: Mr Shoulder Left Wo Contrast  Result Date: 02/15/2018 CLINICAL DATA:  Left shoulder pain for 2 weeks. No known injury. Remote history of surgery. EXAM: MRI OF THE LEFT SHOULDER WITHOUT CONTRAST TECHNIQUE: Multiplanar, multisequence MR imaging of the shoulder was performed. No intravenous contrast was administered. COMPARISON:  None. FINDINGS: Rotator cuff: Significant rotator cuff tendinopathy/tendinosis with marked thinning and interstitial tears. There is a small oblique coursing full-thickness tear involving the supraspinatus tendon near the critical zone but only a 5 mm of retraction. Full-thickness retracted tear involving the infraspinatus tendon with 28 mm of retraction. The subscapularis tendon is intact. Muscles: Mild fatty atrophy of all the shoulder muscles but severe fatty atrophy of the infraspinatus muscle. Biceps long head: Intact. Tendinopathy involving the intra-articular portion. Acromioclavicular Joint: Moderate degenerative changes type 2 acromion. No lateral downsloping or undersurface spurring. Probable prior acromioplasty. Glenohumeral Joint: Moderate to  advanced degenerative changes with joint space narrowing, osteophytic spurring, cartilage loss and subchondral cystic change. There is a small joint effusion and mild synovitis. Labrum:  Degenerated and likely torn. Bones:  No acute bony findings. Other: Expected fluid in the subacromial/subdeltoid bursa. IMPRESSION: 1. Small oblique coursing full-thickness tear involving the supraspinatus tendon and large full-thickness retracted infraspinatus tendon. 2. Advanced fatty atrophy of the infraspinatus muscle. 3. Intact long head biceps tendon.  Moderate tendinopathy. 4. Moderate to advanced glenohumeral joint degenerative changes as described above. Electronically Signed   By: Marijo Sanes M.D.   On: 02/15/2018 11:00   Dg Shoulder Left Port  Result Date: 03/14/2018 CLINICAL DATA:  Left shoulder replacement. EXAM: LEFT SHOULDER - 1 VIEW COMPARISON:  Left shoulder MRI dated February 15, 2018. FINDINGS: Postsurgical changes related to interval left reverse total shoulder arthroplasty. Components are well aligned. No acute fracture or dislocation. IMPRESSION: Interval left reverse  total shoulder arthroplasty without evidence of acute postoperative complication. Electronically Signed   By: Titus Dubin M.D.   On: 03/14/2018 13:16    Disposition: Discharge disposition: 01-Home or Self Care         Follow-up Information    Lattie Corns, PA-C On 03/28/2018.   Specialty:  Physician Assistant Why:  AT [phone] Contact information: [place] [address] [phone]            Signed: Feliberto Gottron 03/15/2018, 9:53 AM

## 2018-03-15 NOTE — Progress Notes (Signed)
Physical Therapy Treatment Patient Details Name: Jay Brown MRN: [id number] DOB: 1941/04/25 Today's Date: 03/15/2018    History of Present Illness Patient is a 77 year old male admitted from home s/p L reverse shoulder replacement.  PMH includes skin CA, osteoporosis, osteomyelitis, HTN, HLD, hx of kidney stones, DM, adenatomous polyps and cervical DJD.    PT Comments    Patient sitting in chair upon PT arrival and did not report pain.  He reported sensation loss to still be present in L thumb which limited active there ex.  PT reviewed NWB precautions of L shoulder, polar care management and wrist and elbow exercises that pt can do until cleared to perform shoulder PROM.  Educated pt concerning importance of slow controlled movement and fall prevention.  Pt was able to navigate 4 steps with use of handrail on R side, CGA.  He was able to ambulate 100 ft with close supervision and presented with impulsive movements at times.  Pt will continue to benefit from skilled PT with focus on strength, ROM, functional mobility and pain management.   Follow Up Recommendations  Home health PT     Equipment Recommendations       Recommendations for Other Services       Precautions / Restrictions Precautions Precautions: Fall Restrictions Weight Bearing Restrictions: Yes LUE Weight Bearing: Non weight bearing    Mobility  Bed Mobility Overal bed mobility: (In recliner upon PT arrival and departure.)             General bed mobility comments: deferred, pt up in recliner  Transfers Overall transfer level: Needs assistance Equipment used: None Transfers: Sit to/from Stand Sit to Stand: Supervision         General transfer comment: Pt able to perform STS without need for physical assist.  Pt presents with impulsive movements at times and PT advised pt to use slow, controlled movements.  Ambulation/Gait Ambulation/Gait assistance: Supervision Ambulation Distance (Feet): 100  Feet       Gait velocity interpretation: 1.31 - 2.62 ft/sec, indicative of limited community ambulator General Gait Details: Pt abel to ambulate to steps and complete with min VC's from PT.  PT provided min VC's for sequencing and reminded pt to move slowly to avoid falling.   Stairs Stairs: Yes Stairs assistance: Min guard Stair Management: One rail Right;Forwards;Alternating pattern Number of Stairs: 4 General stair comments: Pt completed negotiation of 4 steps with use of HR on right side.  PT provided close CGA and pt completed wtihout need for increased time.   Wheelchair Mobility    Modified Rankin (Stroke Patients Only)       Balance Overall balance assessment: Modified Independent                                          Cognition Arousal/Alertness: Awake/alert Behavior During Therapy: WFL for tasks assessed/performed Overall Cognitive Status: Within Functional Limits for tasks assessed                                        Exercises Hand Exercises Wrist Flexion: Strengthening;Left;5 reps;Seated(PT providing resistance) Wrist Extension: Strengthening;Left;5 reps;Seated(PT provided resistance.) Other Exercises Other Exercises: PT reviewed appropriate exercises until cleared to do shoulder ROM.  Pt and daughter expressed understanding. Other Exercises: Pt unable to squeeze  stress ball at this time so PT directed pt to hold PT's hand as though he were shaking hands and was able to squeeze x10 with 3 sec. hold. Other Exercises: pt/family instructed in falls prevention strategies and home/routines modifications to maximize safety and independence    General Comments        Pertinent Vitals/Pain Pain Assessment: No/denies pain    Home Living Family/patient expects to be discharged to:: Private residence Living Arrangements: Spouse/significant other Available Help at Discharge: Family;Available 24 hours/day Type of Home:  House Home Access: Stairs to enter Entrance Stairs-Rails: None Home Layout: One level Home Equipment: None      Prior Function Level of Independence: Independent      Comments: Pt independent with mobility and ADL, able to drive and ambulate in community without AD. 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   PT Goals (current goals can now be found in the care plan section) Acute Rehab PT Goals Patient Stated Goal: To return home and back to general activity. PT Goal Formulation: With patient Potential to Achieve Goals: Good Progress towards PT goals: Progressing toward goals    Frequency    BID      PT Plan      Co-evaluation              AM-PAC PT "6 Clicks" Daily Activity  Outcome Measure  Difficulty turning over in bed (including adjusting bedclothes, sheets and blankets)?: A Little Difficulty moving from lying on back to sitting on the side of the bed? : A Little Difficulty sitting down on and standing up from a chair with arms (e.g., wheelchair, bedside commode, etc,.)?: A Little Help needed moving to and from a bed to chair (including a wheelchair)?: A Little Help needed walking in hospital room?: A Little Help needed climbing 3-5 steps with a railing? : A Little 6 Click Score: 18    End of Session Equipment Utilized During Treatment: Gait belt Activity Tolerance: Patient tolerated treatment well Patient left: in chair;with chair alarm set;with family/visitor present;with call bell/phone within reach Nurse Communication: Mobility status PT Visit Diagnosis: Muscle weakness (generalized) (M62.81);Pain Pain - Right/Left: Left Pain - part of body: Shoulder     Time: 6659-9357 PT Time Calculation (min) (ACUTE ONLY): 27 min  Charges:  $Therapeutic Exercise: 8-22 mins $Therapeutic Activity: 8-22 mins                    G Codes:  Functional Assessment Tool Used: AM-PAC 6 Clicks Basic Mobility    Roxanne Gates, PT, DPT    Roxanne Gates 03/15/2018, 1:33 PM

## 2018-03-15 NOTE — Evaluation (Signed)
Occupational Therapy Evaluation Patient Details Name: Jay Brown MRN: [id number] DOB: [birth year] Today's Date: 03/15/2018    History of Present Illness Patient is a 77 year old male admitted from home s/p L reverse shoulder replacement.  PMH includes skin CA, osteoporosis, osteomyelitis, HTN, HLD, hx of kidney stones, DM, adenatomous polyps and cervical DJD.   Clinical Impression   Patient was evaluated this date by OT. [place] with his spouse. Patient was independent prior to admission including driving, managing the farm, and relatively active requiring no AD. Patient has family to assist at home as needed.Patient has orders for LUE immobilized and will be NWBing per MD.Patient presents with decreased ROM and impaired LUE use resulting in a decreased ability to perform self care tasks. Pt/daughter instructed in hemi techniques for dressing, techniques for underarm grooming and bathing, falls prevention, sling and polar care positioning, management and wear schedule. Pt will benefit from skilled OT care to address these limitations and improve independence in daily tasks to return home with family including further instructions for managing daily self care tasks with use of one hand only.Patient able to return home with family supports and recommend [facility] services following discharge.     Follow Up Recommendations  Home health OT    Equipment Recommendations  None recommended by OT    Recommendations for Other Services       Precautions / Restrictions Precautions Precautions: Fall Restrictions Weight Bearing Restrictions: Yes LUE Weight Bearing: Non weight bearing      Mobility Bed Mobility              General bed mobility comments: deferred, pt up in recliner  Transfers Overall transfer level: Needs assistance Equipment used: None Transfers: Sit to/from Stand Sit to Stand: Supervision         General transfer comment: Pt able to mind  NWB precautions with L UE during transfers.    Balance Overall balance assessment: Modified Independent                                         ADL either performed or assessed with clinical judgement   ADL Overall ADL's : Needs assistance/impaired Eating/Feeding: Independent;Sitting   Grooming: Standing;Modified independent;Cueing for UE precautions;With caregiver independent assisting Grooming Details (indicate cue type and reason): pt/family instructed in underarm grooming techniques for L underarm  Upper Body Bathing: Minimal assistance;Sitting;Cueing for UE precautions;With caregiver independent assisting Upper Body Bathing Details (indicate cue type and reason): pt/family instructed in underarm bathing techniques Lower Body Bathing: Sit to/from stand;Minimal assistance;With caregiver independent assisting Lower Body Bathing Details (indicate cue type and reason): instructed in seated sponge bath while unable to shower Upper Body Dressing : Minimal assistance;Cueing for UE precautions;Sitting;With caregiver independent assisting Upper Body Dressing Details (indicate cue type and reason): pt/family instructed in instructed in hemi techniques for UB dressing Lower Body Dressing: Sit to/from stand;Minimal assistance;With caregiver independent assisting   Toilet Transfer: Supervision/safety;Ambulation;Regular Toilet           Functional mobility during ADLs: Supervision/safety;Cueing for safety       Vision Baseline Vision/History: Wears glasses Wears Glasses: At all times Patient Visual Report: No change from baseline       Perception     Praxis      Pertinent Vitals/Pain Pain Assessment: No/denies pain     Hand Dominance Right   Extremity/Trunk  Assessment Upper Extremity Assessment Upper Extremity Assessment: Overall WFL for tasks assessed;LUE deficits/detail LUE Deficits / Details: interscalene block still working difficulty with active elbow  ROM, full AROM of hand/wrist LUE: Unable to fully assess due to immobilization LUE Sensation: decreased light touch;decreased proprioception   Lower Extremity Assessment Lower Extremity Assessment: Overall WFL for tasks assessed   Cervical / Trunk Assessment Cervical / Trunk Assessment: Normal   Communication Communication Communication: No difficulties   Cognition Arousal/Alertness: Awake/alert Behavior During Therapy: WFL for tasks assessed/performed Overall Cognitive Status: Within Functional Limits for tasks assessed                                     General Comments       Exercises Other Exercises Other Exercises: pt/family instructed in polar care and shoulder sling/immobilizer positioning, wear schedule, and management with OT doffing and then donning everything step by step.  Other Exercises: pt/family instructed in AROM wrist/hand/elbow and notified of PROM shoulder exercises ONLY once pt's nerve block wears off Other Exercises: pt/family instructed in falls prevention strategies and home/routines modifications to maximize safety and independence   Shoulder Instructions      Home Living Family/patient expects to be discharged to:: Private residence Living Arrangements: Spouse/significant other Available Help at Discharge: Family;Available 24 hours/day Type of Home: House Home Access: Stairs to enter [employer] of Steps: 1 Entrance Stairs-Rails: None Home Layout: One level     Bathroom Shower/Tub: Teacher, early years/pre: Standard Bathroom Accessibility: Yes   Home Equipment: None          Prior Functioning/Environment Level of Independence: Independent        Comments: Pt independent with mobility and ADL, able to drive and ambulate in community without AD. 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        OT Problem List: Decreased strength;Decreased knowledge of use of DME or AE;Decreased range of  motion;Decreased knowledge of precautions;Impaired UE functional use;Impaired sensation      OT Treatment/Interventions: Self-care/ADL training;Therapeutic exercise;Therapeutic activities;DME and/or AE instruction;Patient/family education    OT Goals(Current goals can be found in the care plan section) Acute Rehab OT Goals Patient Stated Goal: To return home and back to general activity. OT Goal Formulation: All assessment and education complete, DC therapy ADL Goals Pt Will Perform Upper Body Dressing: with caregiver independent in assisting;sitting;standing Pt/caregiver will Perform Home Exercise Program: Left upper extremity;Increased ROM;With written HEP provided(AROM hand, wrist, and elbow when out of sling/polar care) Additional ADL Goal #1: Pt/family will verbalize understanding of all instructions for bathing, dressing, polar care, and sling wear to maximize recovery and safety.  OT Frequency: Min 1X/week   Barriers to D/C:            Co-evaluation              AM-PAC PT "6 Clicks" Daily Activity     Outcome Measure Help from another person eating meals?: None Help from another person taking care of personal grooming?: None Help from another person toileting, which includes using toliet, bedpan, or urinal?: A Little Help from another person bathing (including washing, rinsing, drying)?: A Little Help from another person to put on and taking off regular upper body clothing?: A Little Help from another person to put on and taking off regular lower body clothing?: A Little 6 Click Score: 20   End of Session Nurse Communication:  Other (comment)(requested RN seek out a larger shoulder sling for improved positioning, comfort, and safety)  Activity Tolerance: Patient tolerated treatment well Patient left: in chair;with call bell/phone within reach;with family/visitor present;Other (comment)(immobilizer and polar care in place, PT in for session)  OT Visit Diagnosis: Other  abnormalities of gait and mobility (R26.89)                Time: 4627-0350 OT Time Calculation (min): 35 min Charges:  OT General Charges $OT Visit: 1 Visit OT Evaluation $OT Eval Low Complexity: 1 Low OT Treatments $Self Care/Home Management : 23-37 mins  Jeni Salles, MPH, MS, OTR/L ascom [phone] 03/15/18, 10:59 AM

## 2018-03-15 NOTE — Progress Notes (Signed)
Discharge instructions and prescriptions given to pt. IV removed. Daughter at bedside. Larger sling applied to pt's left shoulder. No questions or complaints from pt. Pt dressed and ready to go home with daughter.

## 2018-03-15 NOTE — Discharge Instructions (Signed)
Diet: As you were doing prior to hospitalization   Shower: Keep incision site clean and dry at all times  Dressing:  You may change your dressing as needed if there is excess drainage. Change the dressing with sterile gauze dressing.    Activity:  Increase activity slowly as tolerated.  Continue with sling and abduction pillow at all times  To prevent constipation: you may use a stool softener such as -  Colace (over the counter) 100 mg by mouth twice a day  Drink plenty of fluids (prune juice may be helpful) and high fiber foods Miralax (over the counter) for constipation as needed.    Itching:  If you experience itching with your medications, try taking only a single pain pill, or even half a pain pill at a time.  You may take up to 10 pain pills per day, and you can also use benadryl over the counter for itching or also to help with sleep.   Precautions:  If you experience chest pain or shortness of breath - call 911 immediately for transfer to the hospital emergency department!!  If you develop a fever greater that 101 F, purulent drainage from wound, increased redness or drainage from wound, or calf pain-Call [place]                                              Follow- Up Appointment:  Please call for an appointment to be seen in 2 weeks at [facility]

## 2018-03-15 NOTE — Progress Notes (Signed)
   Subjective: 1 Day Post-Op Procedure(s) (LRB): REVERSE SHOULDER ARTHROPLASTY (Left) Patient reports pain as 0 on 0-10 scale.   Patient is well, and has had no acute complaints or problems Denies any CP, SOB, ABD pain. We will start therapy today.  Plan is to go Home after hospital stay.  Objective: Vital signs in last 24 hours: Temp:  [97.3 F (36.3 C)-98.6 F (37 C)] 98.5 F (36.9 C) (04/16 2324) Pulse Rate:  [59-80] 68 (04/16 2324) Resp:  [9-18] 18 (04/16 2324) BP: (112-194)/(61-89) 129/75 (04/16 2324) SpO2:  [94 %-100 %] 95 % (04/16 2324)  Intake/Output from previous day: 04/16 0701 - 04/17 0700 In: 2070 [P.O.:1020; I.V.:950; IV Piggyback:100] Out: 400 [Urine:200; Blood:200] Intake/Output this shift: Total I/O In: -  Out: 200 [Urine:200]  Recent Labs    03/15/18 0410  HGB 12.7*   Recent Labs    03/15/18 0410  WBC 10.8*  RBC 4.26*  HCT 37.5*  PLT 223   Recent Labs    03/15/18 0410  NA 136  K 4.0  CL 109  CO2 24  BUN 19  CREATININE 1.05  GLUCOSE 169*  CALCIUM 8.4*   No results for input(s): LABPT, INR in the last 72 hours.  EXAM General - Patient is Alert, Appropriate and Oriented Left Upper Extremity - Neurovascular intact Sensation intact distally Intact pulses distally No cellulitis present Compartment soft Dressing - dressing C/D/I and no drainage Motor Function - intact, moving wrist and digits well on exam.   Past Medical History:  Diagnosis Date  . Adenomatous polyps   . Degenerative disc disease, cervical    C5,6,7  . Diabetes ([place])   . History of kidney stones   . Hyperlipidemia   . Hypertension    no meds now  . Osteomyelitis (H[facility])    right 3rd toe  . Osteoporosis   . PONV (postoperative nausea and vomiting)   . Sepsis ([place])   . Skin cancer    bilateral arms and face    Assessment/Plan:   1 Day Post-Op Procedure(s) (LRB): REVERSE SHOULDER ARTHROPLASTY (Left) Active Problems:   Status post reverse total shoulder  replacement, left  Estimated body mass index is 31.66 kg/m as calculated from the following:   Height as of this encounter: 6\' 1"  (1.854 m).   Weight as of this encounter: 108.9 kg (240 lb). Advance diet Up with therapy  Discharge home today pending her progress with OT and PT Follow-up with [facility] in 2 weeks  DVT Prophylaxis - Lovenox   T. Rachelle Hora, PA-C [place] 03/15/2018, 6:52 AM

## 2018-03-15 NOTE — Care Management Note (Signed)
Case Management Note  Patient Details  Name: Jay Brown MRN: [id number] Date of Birth: [birth year]  Subjective/Objective:  Met with patient at bedside to discuss discharge planning. Patient lives at home with his spouse who is caregiver. Offered chocie of home health agencies. Referral to [facility] [place] and [place]. Will discharge home on ASA.  No DME.                 Action/Plan:   Expected Discharge Date:  03/15/18               Expected Discharge Plan:  [place]  In-House Referral:     Discharge planning Services  CM Consult  Post Acute Care Choice:  Home Health Choice offered to:  Patient  DME Arranged:    DME Agency:     HH Arranged:  PT, OT [place] Agency:  [facility] (formerly [employer])  Status of Service:  Completed, signed off  If discussed at [employer], dates discussed:    Additional Comments:  Jolly Mango, RN 03/15/2018, 11:01 AM

## 2018-03-16 LAB — SURGICAL PATHOLOGY

## 2018-03-20 ENCOUNTER — Telehealth: Payer: Self-pay

## 2018-03-20 NOTE — Telephone Encounter (Signed)
EMMI Follow-up: Mrs. Dolley said they had just received a call from my number and I explained our automated call back process when patients discharge.  Mr. Latona discharged on Wed., April 17th and she said they had not heard from [facility] to find out when [place] was going to be scheduled.  Placed her on hold and contacted [facility] and talked with Baldo Ash.  She said Anyelo Mccue had just gotten the orders from Dr. Roland Rack and that someone would call this afternoon to coordinated these visits.  Mrs. Levi thanked me for my help.  I let her know to expect one more automated call and to please respond to the prompts if they had any other concerns.

## 2018-04-17 ENCOUNTER — Other Ambulatory Visit: Payer: Self-pay

## 2018-04-17 DIAGNOSIS — E1169 Type 2 diabetes mellitus with other specified complication: Secondary | ICD-10-CM

## 2018-04-17 DIAGNOSIS — I10 Essential (primary) hypertension: Secondary | ICD-10-CM

## 2018-04-17 DIAGNOSIS — Z125 Encounter for screening for malignant neoplasm of prostate: Secondary | ICD-10-CM

## 2018-04-17 DIAGNOSIS — T733XXA Exhaustion due to excessive exertion, initial encounter: Secondary | ICD-10-CM

## 2018-04-17 DIAGNOSIS — E1165 Type 2 diabetes mellitus with hyperglycemia: Secondary | ICD-10-CM

## 2018-04-17 DIAGNOSIS — R635 Abnormal weight gain: Secondary | ICD-10-CM

## 2018-04-17 DIAGNOSIS — Z0000 Encounter for general adult medical examination without abnormal findings: Secondary | ICD-10-CM

## 2018-04-17 DIAGNOSIS — IMO0001 Reserved for inherently not codable concepts without codable children: Secondary | ICD-10-CM

## 2018-04-17 DIAGNOSIS — E785 Hyperlipidemia, unspecified: Principal | ICD-10-CM

## 2018-04-18 ENCOUNTER — Ambulatory Visit (INDEPENDENT_AMBULATORY_CARE_PROVIDER_SITE_OTHER): Payer: Medicare Other

## 2018-04-18 ENCOUNTER — Other Ambulatory Visit: Payer: Medicare Other

## 2018-04-18 VITALS — BP 136/82 | HR 78 | Temp 97.7°F | Resp 16 | Ht 75.0 in | Wt 236.4 lb

## 2018-04-18 DIAGNOSIS — Z0000 Encounter for general adult medical examination without abnormal findings: Secondary | ICD-10-CM | POA: Diagnosis not present

## 2018-04-18 NOTE — Progress Notes (Signed)
Subjective:   Jay Brown is a 77 y.o. male who presents for Medicare Annual/Subsequent preventive examination.  Review of Systems:   Cardiac Risk Factors include: hypertension;male gender;advanced age (>54men, >55 women);dyslipidemia     Objective:    Vitals: BP 136/82 (BP Location: Left Arm, Patient Position: Sitting)   Pulse 78   Temp 97.7 F (36.5 C) (Oral)   Resp 16   Ht 6\' 3"  (1.905 m)   Wt 236 lb 6.4 oz (107.2 kg)   BMI 29.55 kg/m   Body mass index is 29.55 kg/m.  Advanced Directives 04/18/2018 03/14/2018 03/14/2018 03/14/2018 03/06/2018 04/13/2017 04/04/2017  Does Patient Have a Medical Advance Directive? Yes Yes No No No No No  Type of Advance Directive Healthcare Power of Attorney Living will - - - - -  Does patient want to make changes to medical advance directive? - No - Patient declined - - - - -  Copy of [place] in Chart? No - copy requested - - - - - -  Would patient like information on creating a medical advance directive? - No - Patient declined No - Patient declined - No - Patient declined No - Patient declined No - Patient declined    Tobacco Social History   Tobacco Use  Smoking Status Former Smoker  . Packs/day: 0.50  . Years: 10.00  . Pack years: 5.00  . Types: Cigarettes  . Last attempt to quit: 03/06/1978  . Years since quitting: 40.1  Smokeless Tobacco Never Used     Counseling given: Not Answered   Clinical Intake:  Pre-visit preparation completed: Yes  Pain : No/denies pain     Nutritional Status: BMI 25 -29 Overweight Nutritional Risks: None Diabetes: Yes CBG done?: No Did pt. bring in CBG monitor from home?: No  How often do you need to have someone help you when you read instructions, pamphlets, or other written materials from your doctor or pharmacy?: 1 - Never What is the last grade level you completed in school?: 12th grade, some college   Interpreter Needed?: No  Information entered by :: Jay Iversen,LPN   Past Medical History:  Diagnosis Date  . Adenomatous polyps   . Degenerative disc disease, cervical    C5,6,7  . Diabetes ([place])   . History of kidney stones   . Hyperlipidemia   . Hypertension    no meds now  . Osteomyelitis (HCC)    right 3rd toe  . Osteoporosis   . PONV (postoperative nausea and vomiting)   . Sepsis ([place])   . Skin cancer    bilateral arms and face   Past Surgical History:  Procedure Laterality Date  . BACK SURGERY     2012; rods in back  . CARPAL TUNNEL RELEASE Right 04/13/2017   Procedure: CARPAL TUNNEL RELEASE;  Surgeon: Leanor Kail, MD;  Location: [facility] ORS;  Service: Orthopedics;  Laterality: Right;  . CATARACT EXTRACTION W/ INTRAOCULAR LENS  IMPLANT, BILATERAL    . CHOLECYSTECTOMY    . COLONOSCOPY    . JOINT REPLACEMENT    . NECK SURGERY     1980s/90s  . REPLACEMENT TOTAL KNEE Bilateral    lt 1999 rt 2001  . REVERSE SHOULDER ARTHROPLASTY Left 03/14/2018   Procedure: REVERSE SHOULDER ARTHROPLASTY;  Surgeon: Corky Mull, MD;  Location: [facility] ORS;  Service: Orthopedics;  Laterality: Left;  . SHOULDER SURGERY Left    x5 1980s  . TONSILLECTOMY    . WRIST SURGERY Right  2005; 3 bones removed   Family History  Problem Relation Age of Onset  . Alzheimer's disease Mother   . Cancer Father        lung  . Diabetes Sister    Social History   Socioeconomic History  . Marital status: Married    Spouse name: Not on file  . Number of children: Not on file  . Years of education: Not on file  . Highest education level: Not on file  Occupational History  . Not on file  Social Needs  . Financial resource strain: Not hard at all  . Food insecurity:    Worry: Never true    Inability: Never true  . Transportation needs:    Medical: No    Non-medical: No  Tobacco Use  . Smoking status: Former Smoker    Packs/day: 0.50    Years: 10.00    Pack years: 5.00    Types: Cigarettes    Last attempt to quit: 03/06/1978    Years since  quitting: 40.1  . Smokeless tobacco: Never Used  Substance and Sexual Activity  . Alcohol use: No    Alcohol/week: 0.0 oz  . Drug use: No  . Sexual activity: Not Currently  Lifestyle  . Physical activity:    Days per week: 0 days    Minutes per session: 0 min  . Stress: Not at all  Relationships  . Social connections:    Talks on phone: More than three times a week    Gets together: More than three times a week    Attends religious service: More than 4 times per year    Active member of club or organization: No    Attends meetings of clubs or organizations: Never    Relationship status: Married  Other Topics Concern  . Not on file  Social History Narrative  . Not on file    Outpatient Encounter Medications as of 04/18/2018  Medication Sig  . aspirin EC 325 MG tablet Take 1 tablet (325 mg total) by mouth daily.  . Dulaglutide (TRULICITY) 0.35 KK/9.3GH SOPN Inject 0.75 mg into the skin once a week.  . Insulin Glargine (TOUJEO SOLOSTAR) 300 UNIT/ML SOPN Inject 46 Units into the skin daily after supper. (Patient taking differently: Inject 48 Units into the skin daily after supper. )  . acetaminophen (TYLENOL) 500 MG tablet Take 1,000 mg by mouth every 8 (eight) hours as needed.  Marland Kitchen amLODipine (NORVASC) 2.5 MG tablet Take 1 tablet (2.5 mg total) by mouth daily. (Patient not taking: Reported on 03/14/2018)  . atorvastatin (LIPITOR) 10 MG tablet Take 1 tablet (10 mg total) by mouth at bedtime. (Patient not taking: Reported on 03/14/2018)  . HYDROcodone-acetaminophen (NORCO/VICODIN) 5-325 MG tablet Take 1-2 tablets by mouth every 4 (four) hours as needed for moderate pain (pain score 4-6). (Patient not taking: Reported on 04/18/2018)  . lisinopril (PRINIVIL,ZESTRIL) 40 MG tablet Take 1 tablet (40 mg total) by mouth daily. (Patient not taking: Reported on 03/14/2018)   No facility-administered encounter medications on file as of 04/18/2018.     Activities of Daily Living In your present  state of health, do you have any difficulty performing the following activities: 04/18/2018 03/14/2018  Hearing? N -  Vision? N -  Difficulty concentrating or making decisions? N -  Walking or climbing stairs? N -  Dressing or bathing? N -  Doing errands, shopping? N N  Preparing Food and eating ? N -  Using the Toilet? N -  In the past six months, have you accidently leaked urine? N -  Do you have problems with loss of bowel control? N -  Managing your Medications? N -  Managing your Finances? N -  Housekeeping or managing your Housekeeping? N -  Some recent data might be hidden    Patient Care Team: Olin Hauser, DO as PCP - General (Family Medicine) Poggi, Marshall Cork, MD as Consulting Physician (Surgery)   Assessment:   This is a routine wellness examination for Jay Brown.  Exercise Activities and Dietary recommendations Current Exercise Habits: The patient does not participate in regular exercise at present(PT once a week for shoulder), Exercise limited by: None identified  Goals    . DIET - INCREASE WATER INTAKE     Recommend drinking at least 6-8 glasses of water a day        Fall Risk Fall Risk  04/18/2018 01/20/2017 12/14/2016 07/06/2016 07/06/2016  Falls in the past year? Yes No No Yes No  Number falls in past yr: 1 - - 2 or more -  Injury with Fall? No - - No -  Risk for fall due to : - - - - -  Follow up Falls prevention discussed - - - -   Is the patient's home free of loose throw rugs in walkways, pet beds, electrical cords, etc?   yes      Grab bars in the bathroom? no      Handrails on the stairs?   no stairs       Adequate lighting?   yes  Timed Get Up and Go Performed: Completed in 8 seconds with no use of assistive devices, steady gait. No intervention needed at this time.   Depression Screen PHQ 2/9 Scores 04/18/2018 01/24/2018 01/20/2017 12/14/2016  PHQ - 2 Score 0 0 0 0    Cognitive Function MMSE - Mini Mental State Exam 07/06/2016  Orientation to time  5  Orientation to Place 5  Registration 3  Attention/ Calculation 3  Recall 3  Language- name 2 objects 2  Language- repeat 1  Language- follow 3 step command 3  Language- read & follow direction 1  Write a sentence 1  Copy design 1  Total score 28     6CIT Screen 04/18/2018  What Year? 0 points  What month? 0 points  What time? 0 points  Count back from 20 0 points  Months in reverse 0 points  Repeat phrase 2 points  Total Score 2    Immunization History  Administered Date(s) Administered  . DTaP 07/08/2010  . Influenza, Seasonal, Injecte, Preservative Fre 09/11/2007, 08/11/2010  . Influenza-Unspecified 09/11/2015, 09/06/2016, 08/29/2017  . Pneumococcal Conjugate-13 09/18/2014  . Pneumococcal Polysaccharide-23 10/13/2006, 06/30/2010    Qualifies for Shingles Vaccine? Yes, discussed shingrix vaccine   Screening Tests Health Maintenance  Topic Date Due  . OPHTHALMOLOGY EXAM  07/06/2017  . FOOT EXAM  04/27/2018  . INFLUENZA VACCINE  06/29/2018  . HEMOGLOBIN A1C  07/24/2018  . TETANUS/TDAP  01/21/2024  . PNA vac Low Risk Adult  Completed   Cancer Screenings: Lung: Low Dose CT Chest recommended if Age 71-80 years, 30 pack-year currently smoking OR have quit w/in 15years. Patient does not qualify. Colorectal: no longer required  Additional Screenings:  Hepatitis C Screening:not indicated       Plan:    I have personally reviewed and addressed the Medicare Annual Wellness questionnaire and have noted the following in the patient's chart:  A. Medical and  social history B. Use of alcohol, tobacco or illicit drugs  C. Current medications and supplements D. Functional ability and status E.  Nutritional status F.  Physical activity G. Advance directives H. List of other physicians I.  Hospitalizations, surgeries, and ER visits in previous 12 months J.  [place] such as hearing and vision if needed, cognitive and depression L. Referrals and  appointments   In addition, I have reviewed and discussed with patient certain preventive protocols, quality metrics, and best practice recommendations. A written personalized care plan for preventive services as well as general preventive health recommendations were provided to patient.   Signed,  Tyler Aas, LPN Nurse Health Advisor   Nurse Notes: due for diabetic foot exam- CPE on 04/21/2018  Discussed going for diabetic eye exam at [facility]- patient will make appointment.

## 2018-04-18 NOTE — Patient Instructions (Addendum)
Jay Brown , Thank you for taking time to come for your Medicare Wellness Visit. I appreciate your ongoing commitment to your health goals. Please review the following plan we discussed and let me know if I can assist you in the future.   Screening recommendations/referrals: Colonoscopy: no longer required Recommended yearly ophthalmology/optometry visit for glaucoma screening and checkup Recommended yearly dental visit for hygiene and checkup  Vaccinations: Influenza vaccine: due 07/2018 Pneumococcal vaccine: completed series Tdap vaccine: up to date Shingles vaccine: shingrix eligible, check with your insurance company for coverage information.      Advanced directives: Please bring a copy of your health care power of attorney and living will to the office at your convenience.  Conditions/risks identified: Recommend drinking at least 6-8 glasses of water a day   Next appointment: Follow up on 04/21/2018 at 10:40am with Dr.Karamalegos. Follow up in one year for your annual wellness exam.   Preventive Care 65 Years and Older, Male Preventive care refers to lifestyle choices and visits with your health care provider that can promote health and wellness. What does preventive care include?  A yearly physical exam. This is also called an annual well check.  Dental exams once or twice a year.  Routine eye exams. Ask your health care provider how often you should have your eyes checked.  Personal lifestyle choices, including:  Daily care of your teeth and gums.  Regular physical activity.  Eating a healthy diet.  Avoiding tobacco and drug use.  Limiting alcohol use.  Practicing safe sex.  Taking low doses of aspirin every day.  Taking vitamin and mineral supplements as recommended by your health care provider. What happens during an annual well check? The services and screenings done by your health care provider during your annual well check will depend on your age,  overall health, lifestyle risk factors, and family history of disease. Counseling  Your health care provider may ask you questions about your:  Alcohol use.  Tobacco use.  Drug use.  Emotional well-being.  Home and relationship well-being.  Sexual activity.  Eating habits.  History of falls.  Memory and ability to understand (cognition).  Work and work Statistician. Screening  You may have the following tests or measurements:  Height, weight, and BMI.  Blood pressure.  Lipid and cholesterol levels. These may be checked every 5 years, or more frequently if you are over 41 years old.  Skin check.  Lung cancer screening. You may have this screening every year starting at age 82 if you have a 30-pack-year history of smoking and currently smoke or have quit within the past 15 years.  Fecal occult blood test (FOBT) of the stool. You may have this test every year starting at age 109.  Flexible sigmoidoscopy or colonoscopy. You may have a sigmoidoscopy every 5 years or a colonoscopy every 10 years starting at age 18.  Prostate cancer screening. Recommendations will vary depending on your family history and other risks.  Hepatitis C blood test.  Hepatitis B blood test.  Sexually transmitted disease (STD) testing.  Diabetes screening. This is done by checking your blood sugar (glucose) after you have not eaten for a while (fasting). You may have this done every 1-3 years.  Abdominal aortic aneurysm (AAA) screening. You may need this if you are a current or former smoker.  Osteoporosis. You may be screened starting at age 49 if you are at high risk. Talk with your health care provider about your test results, treatment options,  and if necessary, the need for more tests. Vaccines  Your health care provider may recommend certain vaccines, such as:  Influenza vaccine. This is recommended every year.  Tetanus, diphtheria, and acellular pertussis (Tdap, Td) vaccine. You may  need a Td booster every 10 years.  Zoster vaccine. You may need this after age 74.  Pneumococcal 13-valent conjugate (PCV13) vaccine. One dose is recommended after age 101.  Pneumococcal polysaccharide (PPSV23) vaccine. One dose is recommended after age 40. Talk to your health care provider about which screenings and vaccines you need and how often you need them. This information is not intended to replace advice given to you by your health care provider. Make sure you discuss any questions you have with your health care provider. Document Released: 12/12/2015 Document Revised: 08/04/2016 Document Reviewed: 09/16/2015 Elsevier Interactive Patient Education  2017 [place] Prevention in the Home Falls can cause injuries. They can happen to people of all ages. There are many things you can do to make your home safe and to help prevent falls. What can I do on the outside of my home?  Regularly fix the edges of walkways and driveways and fix any cracks.  Remove anything that might make you trip as you walk through a door, such as a raised step or threshold.  Trim any bushes or trees on the path to your home.  Use bright outdoor lighting.  Clear any walking paths of anything that might make someone trip, such as rocks or tools.  Regularly check to see if handrails are loose or broken. Make sure that both sides of any steps have handrails.  Any raised decks and porches should have guardrails on the edges.  Have any leaves, snow, or ice cleared regularly.  Use sand or salt on walking paths during winter.  Clean up any spills in your garage right away. This includes oil or grease spills. What can I do in the bathroom?  Use night lights.  Install grab bars by the toilet and in the tub and shower. Do not use towel bars as grab bars.  Use non-skid mats or decals in the tub or shower.  If you need to sit down in the shower, use a plastic, non-slip stool.  Keep the floor  dry. Clean up any water that spills on the floor as soon as it happens.  Remove soap buildup in the tub or shower regularly.  Attach bath mats securely with double-sided non-slip rug tape.  Do not have throw rugs and other things on the floor that can make you trip. What can I do in the bedroom?  Use night lights.  Make sure that you have a light by your bed that is easy to reach.  Do not use any sheets or blankets that are too big for your bed. They should not hang down onto the floor.  Have a firm chair that has side arms. You can use this for support while you get dressed.  Do not have throw rugs and other things on the floor that can make you trip. What can I do in the kitchen?  Clean up any spills right away.  Avoid walking on wet floors.  Keep items that you use a lot in easy-to-reach places.  If you need to reach something above you, use a strong step stool that has a grab bar.  Keep electrical cords out of the way.  Do not use floor polish or wax that makes floors slippery. If  you must use wax, use non-skid floor wax.  Do not have throw rugs and other things on the floor that can make you trip. What can I do with my stairs?  Do not leave any items on the stairs.  Make sure that there are handrails on both sides of the stairs and use them. Fix handrails that are broken or loose. Make sure that handrails are as long as the stairways.  Check any carpeting to make sure that it is firmly attached to the stairs. Fix any carpet that is loose or worn.  Avoid having throw rugs at the top or bottom of the stairs. If you do have throw rugs, attach them to the floor with carpet tape.  Make sure that you have a light switch at the top of the stairs and the bottom of the stairs. If you do not have them, ask someone to add them for you. What else can I do to help prevent falls?  Wear shoes that:  Do not have high heels.  Have rubber bottoms.  Are comfortable and fit you  well.  Are closed at the toe. Do not wear sandals.  If you use a stepladder:  Make sure that it is fully opened. Do not climb a closed stepladder.  Make sure that both sides of the stepladder are locked into place.  Ask someone to hold it for you, if possible.  Clearly mark and make sure that you can see:  Any grab bars or handrails.  First and last steps.  Where the edge of each step is.  Use tools that help you move around (mobility aids) if they are needed. These include:  Canes.  Walkers.  Scooters.  Crutches.  Turn on the lights when you go into a dark area. Replace any light bulbs as soon as they burn out.  Set up your furniture so you have a clear path. Avoid moving your furniture around.  If any of your floors are uneven, fix them.  If there are any pets around you, be aware of where they are.  Review your medicines with your doctor. Some medicines can make you feel dizzy. This can increase your chance of falling. Ask your doctor what other things that you can do to help prevent falls. This information is not intended to replace advice given to you by your health care provider. Make sure you discuss any questions you have with your health care provider. Document Released: 09/11/2009 Document Revised: 04/22/2016 Document Reviewed: 12/20/2014 Elsevier Interactive Patient Education  2017 [employer].

## 2018-04-19 ENCOUNTER — Encounter: Payer: Self-pay | Admitting: Family Medicine

## 2018-04-19 DIAGNOSIS — R972 Elevated prostate specific antigen [PSA]: Secondary | ICD-10-CM | POA: Insufficient documentation

## 2018-04-19 LAB — COMPLETE METABOLIC PANEL WITH GFR
AG RATIO: 1.2 (calc) (ref 1.0–2.5)
ALBUMIN MSPROF: 3.9 g/dL (ref 3.6–5.1)
ALT: 14 U/L (ref 9–46)
AST: 15 U/L (ref 10–35)
Alkaline phosphatase (APISO): 87 U/L (ref 40–115)
BUN: 16 mg/dL (ref 7–25)
CALCIUM: 9.4 mg/dL (ref 8.6–10.3)
CO2: 30 mmol/L (ref 20–32)
CREATININE: 1.09 mg/dL (ref 0.70–1.18)
Chloride: 101 mmol/L (ref 98–110)
GFR, EST AFRICAN AMERICAN: 76 mL/min/{1.73_m2} (ref >=60)
GFR, Est Non African American: 66 mL/min/{1.73_m2} (ref >=60)
GLOBULIN: 3.2 g/dL (ref 1.9–3.7)
Glucose, Bld: 295 mg/dL — ABNORMAL HIGH (ref 65–99)
POTASSIUM: 3.8 mmol/L (ref 3.5–5.3)
SODIUM: 137 mmol/L (ref 135–146)
Total Bilirubin: 0.5 mg/dL (ref 0.2–1.2)
Total Protein: 7.1 g/dL (ref 6.1–8.1)

## 2018-04-19 LAB — CBC WITH DIFFERENTIAL/PLATELET
BASOS ABS: 94 {cells}/uL (ref 0–200)
Basophils Relative: 1 %
Eosinophils Absolute: 291 cells/uL (ref 15–500)
Eosinophils Relative: 3.1 %
HEMATOCRIT: 40.9 % (ref 38.5–50.0)
HEMOGLOBIN: 13.4 g/dL (ref 13.2–17.1)
LYMPHS ABS: 2153 {cells}/uL (ref 850–3900)
MCH: 28.4 pg (ref 27.0–33.0)
MCHC: 32.8 g/dL (ref 32.0–36.0)
MCV: 86.7 fL (ref 80.0–100.0)
MPV: 10.9 fL (ref 7.5–12.5)
Monocytes Relative: 9.3 %
NEUTROS ABS: 5988 {cells}/uL (ref 1500–7800)
NEUTROS PCT: 63.7 %
Platelets: 240 10*3/uL (ref 140–400)
RBC: 4.72 10*6/uL (ref 4.20–5.80)
RDW: 12.6 % (ref 11.0–15.0)
Total Lymphocyte: 22.9 %
WBC mixed population: 874 cells/uL (ref 200–950)
WBC: 9.4 10*3/uL (ref 3.8–10.8)

## 2018-04-19 LAB — HEMOGLOBIN A1C
Hgb A1c MFr Bld: 9.1 % of total Hgb — ABNORMAL HIGH (ref <5.7)
MEAN PLASMA GLUCOSE: 214 (calc)
eAG (mmol/L): 11.9 (calc)

## 2018-04-19 LAB — REFLEX PSA, FREE
PSA, % Free: 7 % (calc) — ABNORMAL LOW (ref >25)
PSA, Free: 0.6 ng/mL

## 2018-04-19 LAB — LIPID PANEL
CHOL/HDL RATIO: 5.2 (calc) — AB (ref <5.0)
CHOLESTEROL: 165 mg/dL (ref <200)
HDL: 32 mg/dL — ABNORMAL LOW (ref >40)
LDL CHOLESTEROL (CALC): 106 mg/dL — AB
Non-HDL Cholesterol (Calc): 133 mg/dL (calc) — ABNORMAL HIGH (ref <130)
TRIGLYCERIDES: 153 mg/dL — AB (ref <150)

## 2018-04-19 LAB — TSH: TSH: 1.41 mIU/L (ref 0.40–4.50)

## 2018-04-19 LAB — PSA, TOTAL WITH REFLEX TO PSA, FREE: PSA, TOTAL: 9 ng/mL — AB (ref <=4.0)

## 2018-04-19 IMAGING — DX DG SHOULDER 1V*L*
2 series · 2 of 2 positions shown · non-contrast
Comparison: Left shoulder MRI dated February 15, 2018.

CLINICAL DATA: Left shoulder replacement.

EXAM:
LEFT SHOULDER - 1 VIEW

[shoulder ap]
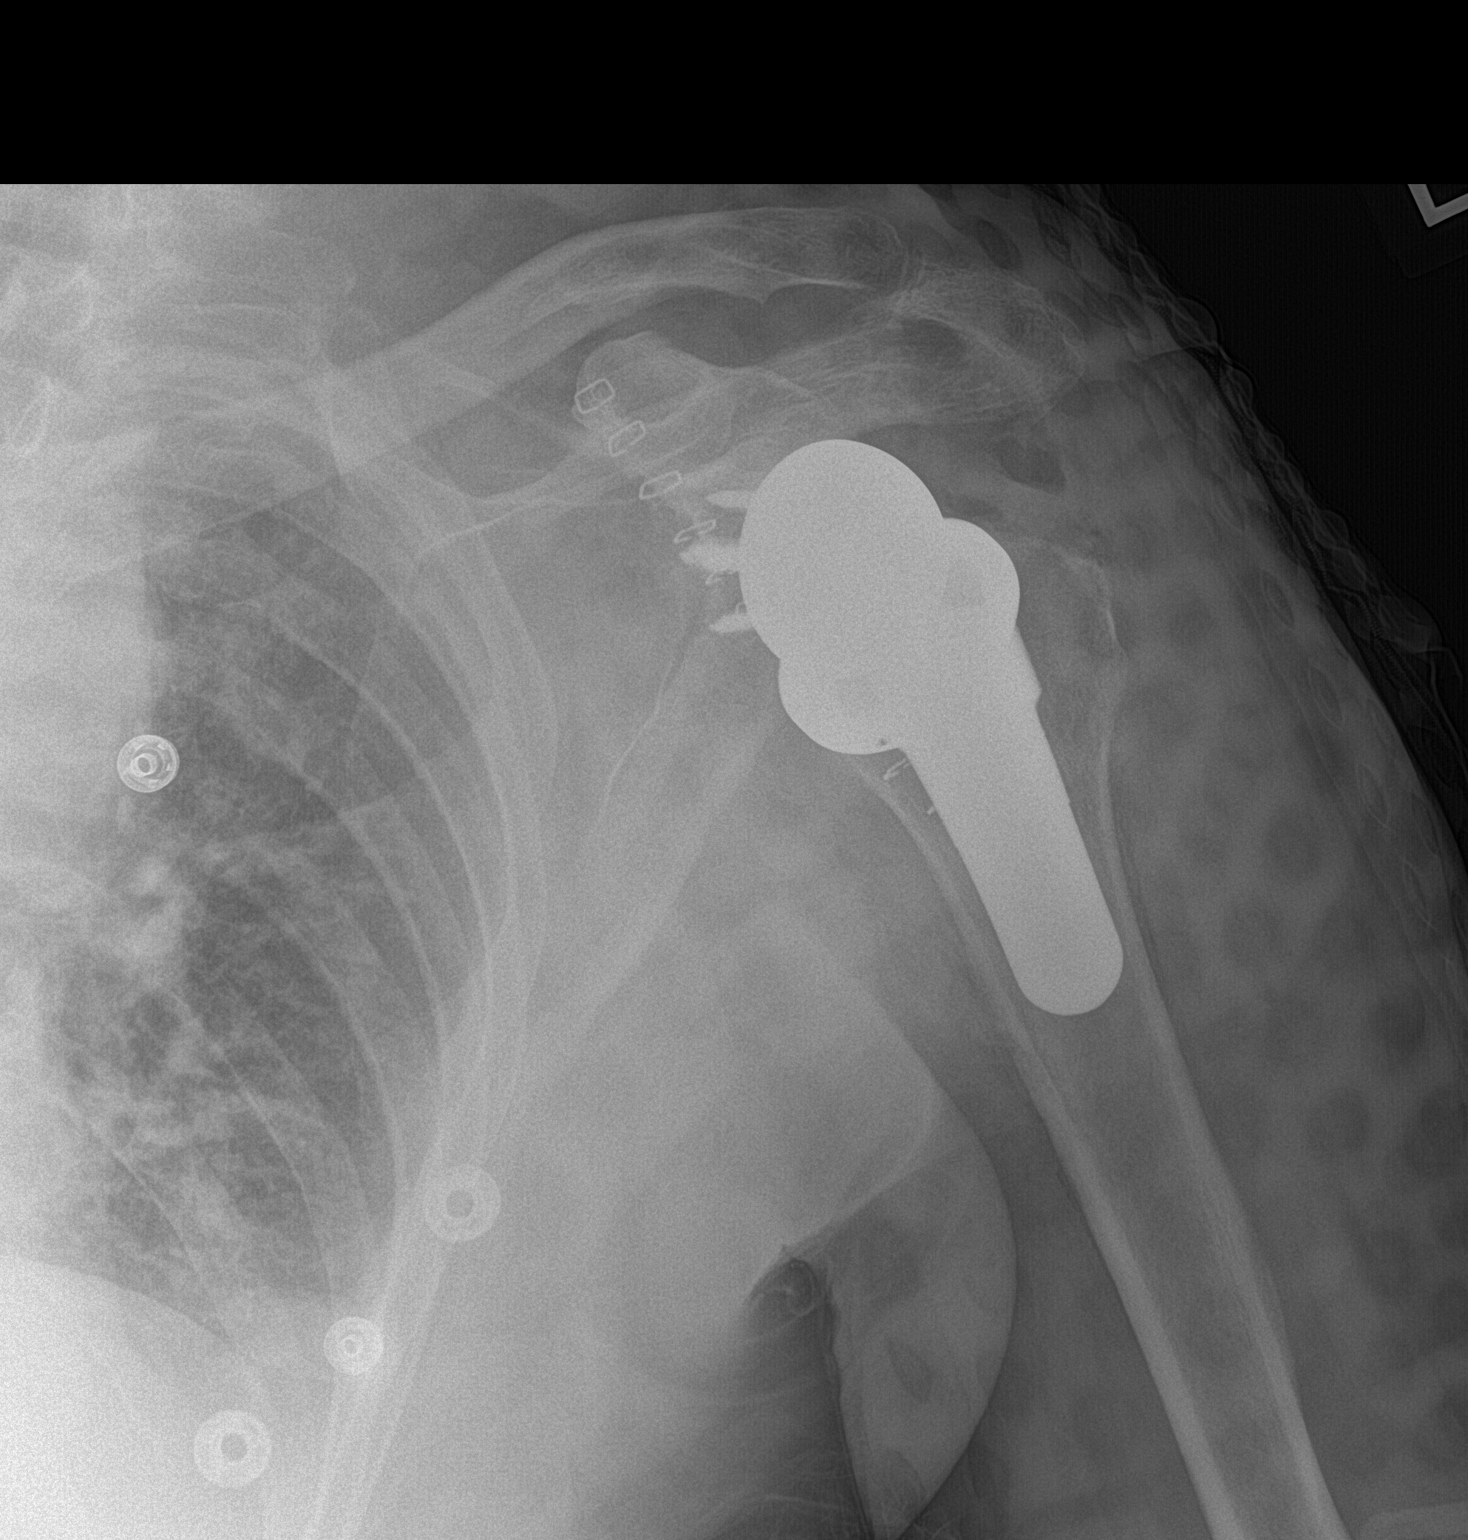

[shoulder obl]
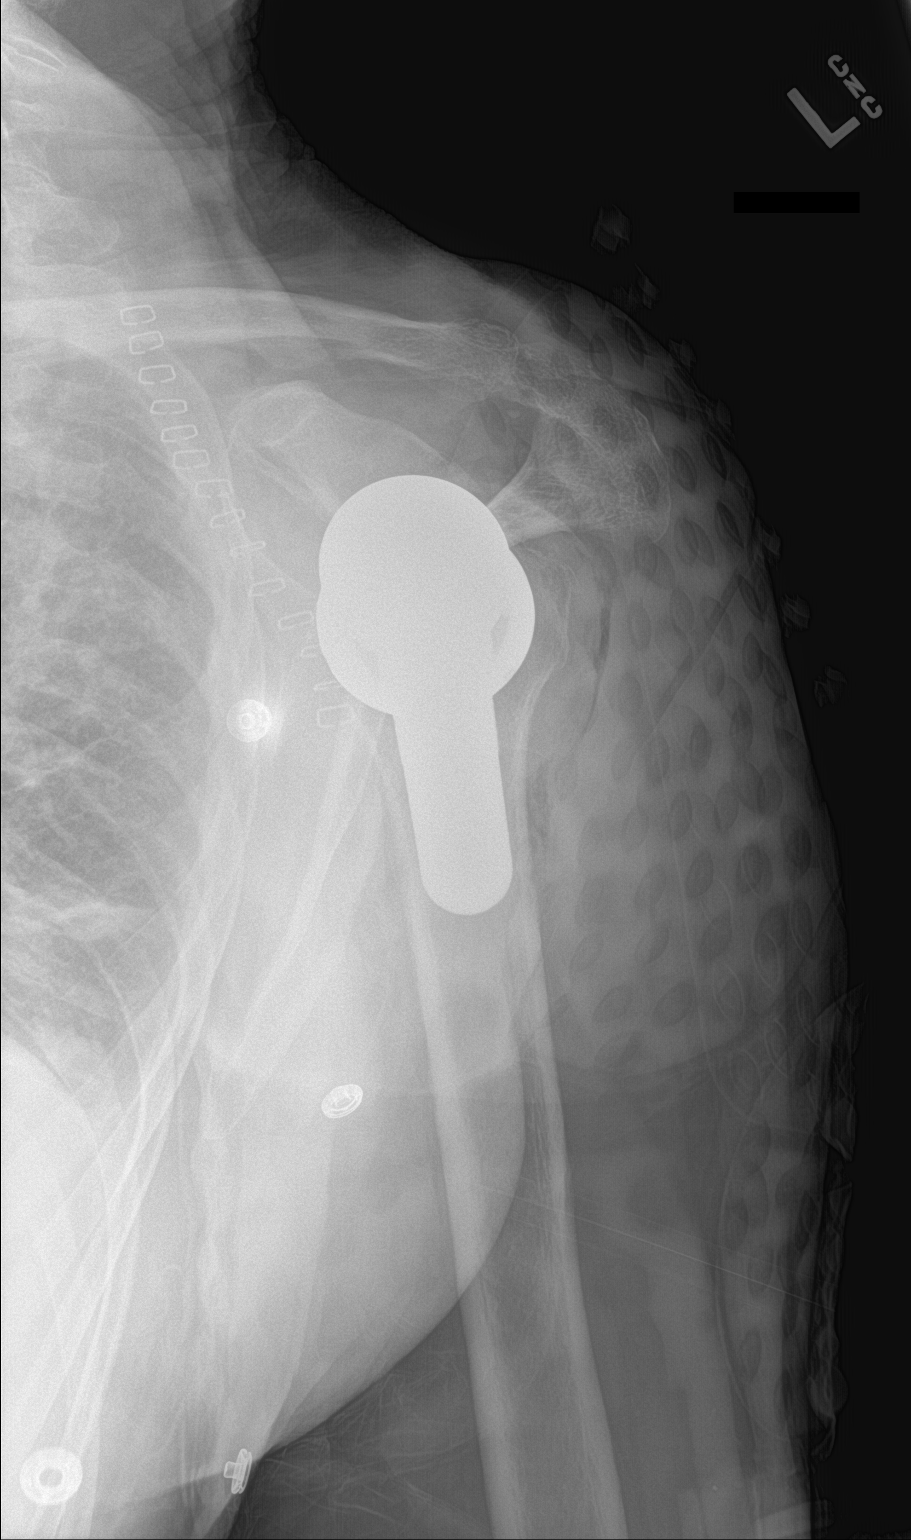

[2 of 2 positions shown; findings below may reference images not displayed]

FINDINGS: Postsurgical changes related to interval left reverse total shoulder
arthroplasty. Components are well aligned. No acute fracture or
dislocation.
IMPRESSION: Interval left reverse total shoulder arthroplasty without evidence
of acute postoperative complication.

## 2018-04-21 ENCOUNTER — Ambulatory Visit (INDEPENDENT_AMBULATORY_CARE_PROVIDER_SITE_OTHER): Payer: Medicare Other | Admitting: Family Medicine

## 2018-04-21 ENCOUNTER — Encounter: Payer: Self-pay | Admitting: Family Medicine

## 2018-04-21 ENCOUNTER — Other Ambulatory Visit: Payer: Medicare Other

## 2018-04-21 VITALS — BP 138/55 | HR 70 | Temp 98.1°F | Resp 16 | Ht 75.0 in | Wt 238.0 lb

## 2018-04-21 DIAGNOSIS — Z96612 Presence of left artificial shoulder joint: Secondary | ICD-10-CM

## 2018-04-21 DIAGNOSIS — E1169 Type 2 diabetes mellitus with other specified complication: Secondary | ICD-10-CM | POA: Diagnosis not present

## 2018-04-21 DIAGNOSIS — Z0000 Encounter for general adult medical examination without abnormal findings: Secondary | ICD-10-CM

## 2018-04-21 DIAGNOSIS — R972 Elevated prostate specific antigen [PSA]: Secondary | ICD-10-CM

## 2018-04-21 DIAGNOSIS — E1165 Type 2 diabetes mellitus with hyperglycemia: Secondary | ICD-10-CM

## 2018-04-21 DIAGNOSIS — Z0001 Encounter for general adult medical examination with abnormal findings: Secondary | ICD-10-CM | POA: Diagnosis not present

## 2018-04-21 DIAGNOSIS — E785 Hyperlipidemia, unspecified: Secondary | ICD-10-CM

## 2018-04-21 DIAGNOSIS — IMO0001 Reserved for inherently not codable concepts without codable children: Secondary | ICD-10-CM

## 2018-04-21 MED ORDER — DULAGLUTIDE 1.5 MG/0.5ML ~~LOC~~ SOAJ: 1.5000 mg | SUBCUTANEOUS | 3 refills | Status: AC

## 2018-04-21 NOTE — Assessment & Plan Note (Signed)
Partially controlled lipids on statin / improving lifestyle Last lipid panel 03/2018 Calculated ASCVD 10 yr risk score is elevated  Plan: 1. Continue current meds - Atorvastatin 10mg  daily 2. Continue ASA 325mg  for primary ASCVD risk reduction 3. Encourage improved lifestyle - low carb/cholesterol, reduce portion size, continue improving regular exercise 4. Follow-up yearly lipid

## 2018-04-21 NOTE — Progress Notes (Signed)
Subjective:    Patient ID: Jay Brown, male    DOB: 1941-04-27, 77 y.o.   MRN: [id number]  Jay Brown is a 77 y.o. male presenting on 04/21/2018 for Annual Exam   HPI   Here for Annual Physical and Lab Review.  CHRONIC DM, Type 2: Reports doing well, seems improved on Trulicity CBGs: Avgapprox 130-150, Low >80s, highest < 300 Meds:Toujeo 48 units daily in evening w/ dinner, Trulicity 0.75mg  weekly [place] inj Reports good compliance. Tolerating well w/o side-effects Currently on ACEi, on Atorvastatin 10mg  Lifestyle: -Diet (limited DM diet adherence now, tries to reduce carb/sugar now more) -Exercise (increased  work daily on farm) - No longer taking ASA 81mg  daily - Established with [facility] - will schedule apt, needs updated DM Eye Exam Denies hypoglycemia  HYPERLIPIDEMIA: - Reports no concerns. Last lipid panel 03/2018,  - Currently taking Atorvastatin 10mg , tolerating well without side effects or myalgias  Elevated PSA No prior history of BPH. Never had PSA checked before by report and not available in chart. Recent lab with PSA 9 and low free %.  No fam history of prostate CA  AUA BPH Symptom Score over past 1 month 1. Sensation of not emptying bladder post void - 0 2. Urinate less than 2 hour after finish last void - 1 3. Start/Stop several times during void - 0 4. Difficult to postpone urination - 0 5. Weak urinary stream - 0 6. Push or strain urination - 0 7. Nocturia - 2-3 times  Total Score: 2 (Minimal to Mild BPH symptoms)  S/p L Shoulder reverse arthroplasty - by Dr Roland Rack. Patient just taken out of sling recently, working on PT  History of Fatigue Last visit discussion on some exertional fatigue seems not consistent with symptoms but seems more "tired easy". Has denied referral recently. He has prior history of normal cardiac work-up done at [facility] years ago. - Interval update reviewed labs with CBC Hgb normal, and TSH normal  Health  Maintenance: UTD Pneumonia vaccines, UTD TDap   Depression screen [facility] 2/9 04/21/2018 04/18/2018 01/24/2018  Decreased Interest 0 0 0  Down, Depressed, Hopeless 0 0 0  PHQ - 2 Score 0 0 0    Past Medical History:  Diagnosis Date  . Adenomatous polyps   . Degenerative disc disease, cervical    C5,6,7  . Diabetes ([place])   . History of kidney stones   . Hyperlipidemia   . Hypertension    no meds now  . Osteomyelitis (HCC)    right 3rd toe  . Osteoporosis   . PONV (postoperative nausea and vomiting)   . Skin cancer    bilateral arms and face   Past Surgical History:  Procedure Laterality Date  . BACK SURGERY     2012; rods in back  . CARPAL TUNNEL RELEASE Right 04/13/2017   Procedure: CARPAL TUNNEL RELEASE;  Surgeon: Leanor Kail, MD;  Location: [facility] ORS;  Service: Orthopedics;  Laterality: Right;  . CATARACT EXTRACTION W/ INTRAOCULAR LENS  IMPLANT, BILATERAL    . CHOLECYSTECTOMY    . COLONOSCOPY    . JOINT REPLACEMENT    . NECK SURGERY     1980s/90s  . REPLACEMENT TOTAL KNEE Bilateral    lt 1999 rt 2001  . REVERSE SHOULDER ARTHROPLASTY Left 03/14/2018   Procedure: REVERSE SHOULDER ARTHROPLASTY;  Surgeon: Corky Mull, MD;  Location: [facility] ORS;  Service: Orthopedics;  Laterality: Left;  . SHOULDER SURGERY Left    x5 1980s  .  TONSILLECTOMY    . WRIST SURGERY Right    2005; 3 bones removed   Social History   Socioeconomic History  . Marital status: Married    Spouse name: Not on file  . Number of children: Not on file  . Years of education: Not on file  . Highest education level: Not on file  Occupational History  . Not on file  Social Needs  . Financial resource strain: Not hard at all  . Food insecurity:    Worry: Never true    Inability: Never true  . Transportation needs:    Medical: No    Non-medical: No  Tobacco Use  . Smoking status: Former Smoker    Packs/day: 0.50    Years: 10.00    Pack years: 5.00    Types: Cigarettes    Last attempt to quit:  03/06/1978    Years since quitting: 40.1  . Smokeless tobacco: Never Used  Substance and Sexual Activity  . Alcohol use: No    Alcohol/week: 0.0 oz  . Drug use: No  . Sexual activity: Not Currently  Lifestyle  . Physical activity:    Days per week: 0 days    Minutes per session: 0 min  . Stress: Not at all  Relationships  . Social connections:    Talks on phone: More than three times a week    Gets together: More than three times a week    Attends religious service: More than 4 times per year    Active member of club or organization: No    Attends meetings of clubs or organizations: Never    Relationship status: Married  . Intimate partner violence:    Fear of current or ex partner: No    Emotionally abused: No    Physically abused: No    Forced sexual activity: No  Other Topics Concern  . Not on file  Social History Narrative  . Not on file   Family History  Problem Relation Age of Onset  . Alzheimer's disease Mother   . Cancer Father        lung  . Diabetes Sister    Current Outpatient Medications on File Prior to Visit  Medication Sig  . acetaminophen (TYLENOL) 500 MG tablet Take 1,000 mg by mouth every 8 (eight) hours as needed.  Marland Kitchen amLODipine (NORVASC) 2.5 MG tablet Take 1 tablet (2.5 mg total) by mouth daily.  Marland Kitchen aspirin EC 325 MG tablet Take 1 tablet (325 mg total) by mouth daily.  Marland Kitchen atorvastatin (LIPITOR) 10 MG tablet Take 1 tablet (10 mg total) by mouth at bedtime.  Marland Kitchen HYDROcodone-acetaminophen (NORCO/VICODIN) 5-325 MG tablet Take 1-2 tablets by mouth every 4 (four) hours as needed for moderate pain (pain score 4-6).  . Insulin Glargine (TOUJEO SOLOSTAR) 300 UNIT/ML SOPN Inject 46 Units into the skin daily after supper. (Patient taking differently: Inject 48 Units into the skin daily after supper. )  . lisinopril (PRINIVIL,ZESTRIL) 40 MG tablet Take 1 tablet (40 mg total) by mouth daily.   No current facility-administered medications on file prior to visit.      Review of Systems  Constitutional: Negative for activity change, appetite change, chills, diaphoresis, fatigue and fever.  HENT: Negative for congestion and hearing loss.   Eyes: Negative for discharge and visual disturbance.  Respiratory: Negative for apnea, cough, choking, chest tightness, shortness of breath and wheezing.   Cardiovascular: Negative for chest pain, palpitations and leg swelling.  Gastrointestinal: Negative for abdominal pain,  anal bleeding, blood in stool, constipation, diarrhea, nausea and vomiting.  Endocrine: Negative for cold intolerance.  Genitourinary: Negative for decreased urine volume, difficulty urinating, dysuria, frequency, hematuria, testicular pain and urgency.  Musculoskeletal: Negative for arthralgias, back pain and neck pain.  Skin: Negative for rash.  Allergic/Immunologic: Negative for environmental allergies.  Neurological: Negative for dizziness, weakness, light-headedness, numbness and headaches.  Hematological: Negative for adenopathy.  Psychiatric/Behavioral: Negative for behavioral problems, dysphoric mood and sleep disturbance. The patient is not nervous/anxious.    Per HPI unless specifically indicated above     Objective:    BP (!) 138/55   Pulse 70   Temp 98.1 F (36.7 C) (Oral)   Resp 16   Ht 6\' 3"  (1.905 m)   Wt 238 lb (108 kg)   BMI 29.75 kg/m   Wt Readings from Last 3 Encounters:  04/21/18 238 lb (108 kg)  04/18/18 236 lb 6.4 oz (107.2 kg)  03/14/18 240 lb (108.9 kg)    Physical Exam  Constitutional: He is oriented to person, place, and time. He appears well-developed and well-nourished. No distress.  Well-appearing, comfortable, cooperative, overweight  HENT:  Head: Normocephalic and atraumatic.  Mouth/Throat: Oropharynx is clear and moist.  Eyes: Pupils are equal, round, and reactive to light. Conjunctivae and EOM are normal. Right eye exhibits no discharge. Left eye exhibits no discharge.  Neck: Normal range of  motion. Neck supple. No thyromegaly present.  Cardiovascular: Normal rate, regular rhythm, normal heart sounds and intact distal pulses.  No murmur heard. Pulmonary/Chest: Effort normal and breath sounds normal. No respiratory distress. He has no wheezes. He has no rales.  Abdominal: Soft. Bowel sounds are normal. He exhibits no distension and no mass. There is no tenderness.  Musculoskeletal: Normal range of motion. He exhibits no edema or tenderness.  Upper / Lower Extremities: - Normal muscle tone, strength bilateral upper extremities 5/5, lower extremities 5/5 - EXCEPT L Upper Extremity shoulder with some mild reduced ROM and residual sore/stiffness, recently out of sling post-op surgery (reverse shoulder arthroplasty)  Lymphadenopathy:    He has no cervical adenopathy.  Neurological: He is alert and oriented to person, place, and time.  Distal sensation intact to light touch all extremities  Skin: Skin is warm and dry. No rash noted. He is not diaphoretic. No erythema.  Psychiatric: He has a normal mood and affect. His behavior is normal.  Well groomed, good eye contact, normal speech and thoughts  Nursing note and vitals reviewed.    Diabetic Foot Exam - Simple   Simple Foot Form Diabetic Foot exam was performed with the following findings:  Yes 04/21/2018 11:23 AM  Visual Inspection See comments:  Yes Sensation Testing See comments:  Yes Pulse Check Posterior Tibialis and Dorsalis pulse intact bilaterally:  Yes Comments Right foot chronic old injury middle toe distal amputation, well healed. Has thickening bilateral toenail without localized infection. Some R>L hallux valgus bunion deformity. Some callus formation bilateral heels and medial forefoot. Chronic reduced sensation monofilament R forefoot and great toe, intact otherwise including Left foot entire foot.     Results for orders placed or performed in visit on 04/17/18  TSH  Result Value Ref Range   TSH 1.41 0.40 -  4.50 mIU/L  PSA, Total with Reflex to PSA, Free  Result Value Ref Range   PSA, Total 9.0 (H) < OR = 4.0 ng/mL  Lipid panel  Result Value Ref Range   Cholesterol 165 <200 mg/dL   HDL 32 (L) >40  mg/dL   Triglycerides 153 (H) <150 mg/dL   LDL Cholesterol (Calc) 106 (H) mg/dL (calc)   Total CHOL/HDL Ratio 5.2 (H) <5.0 (calc)   Non-HDL Cholesterol (Calc) 133 (H) <130 mg/dL (calc)  CBC with Differential/Platelet  Result Value Ref Range   WBC 9.4 3.8 - 10.8 Thousand/uL   RBC 4.72 4.20 - 5.80 Million/uL   Hemoglobin 13.4 13.2 - 17.1 g/dL   HCT 40.9 38.5 - 50.0 %   MCV 86.7 80.0 - 100.0 fL   MCH 28.4 27.0 - 33.0 pg   MCHC 32.8 32.0 - 36.0 g/dL   RDW 12.6 11.0 - 15.0 %   Platelets 240 140 - 400 Thousand/uL   MPV 10.9 7.5 - 12.5 fL   Neutro Abs 5,988 1,500 - 7,800 cells/uL   Lymphs Abs 2,153 850 - 3,900 cells/uL   WBC mixed population 874 200 - 950 cells/uL   Eosinophils Absolute 291 15 - 500 cells/uL   Basophils Absolute 94 0 - 200 cells/uL   Neutrophils Relative % 63.7 %   Total Lymphocyte 22.9 %   Monocytes Relative 9.3 %   Eosinophils Relative 3.1 %   Basophils Relative 1.0 %  Hemoglobin A1c  Result Value Ref Range   Hgb A1c MFr Bld 9.1 (H) <5.7 % of total Hgb   Mean Plasma Glucose 214 (calc)   eAG (mmol/L) 11.9 (calc)  COMPLETE METABOLIC PANEL WITH GFR  Result Value Ref Range   Glucose, Bld 295 (H) 65 - 99 mg/dL   BUN 16 7 - 25 mg/dL   Creat 1.09 0.70 - 1.18 mg/dL   GFR, Est Non African American 66 > OR = 60 mL/min/1.35m2   GFR, Est African American 76 > OR = 60 mL/min/1.27m2   BUN/Creatinine Ratio NOT APPLICABLE 6 - 22 (calc)   Sodium 137 135 - 146 mmol/L   Potassium 3.8 3.5 - 5.3 mmol/L   Chloride 101 98 - 110 mmol/L   CO2 30 20 - 32 mmol/L   Calcium 9.4 8.6 - 10.3 mg/dL   Total Protein 7.1 6.1 - 8.1 g/dL   Albumin 3.9 3.6 - 5.1 g/dL   Globulin 3.2 1.9 - 3.7 g/dL (calc)   AG Ratio 1.2 1.0 - 2.5 (calc)   Total Bilirubin 0.5 0.2 - 1.2 mg/dL   Alkaline phosphatase  (APISO) 87 40 - 115 U/L   AST 15 10 - 35 U/L   ALT 14 9 - 46 U/L  reflex PSA, Free  Result Value Ref Range   PSA, Free 0.6 ng/mL   PSA, % Free 7 (L) >25 % (calc)      Assessment & Plan:   Problem List Items Addressed This Visit    Elevated PSA    Avg risk patient, without known prior reported negative screening - Last PSA 9 (03/2018) w/ low free% - Clinically asymptomatic, without LUTS. AUABPH score 2  Plan: 1. Elevated PSA discussed result - agree to refer to Urology sent to [facility] - reviewed prostate cancer screening guidelines and risks including potential prostate biopsy - they will monitor prostate surveillance.      Relevant Orders   Ambulatory referral to Urology   Hyperlipidemia associated with type 2 diabetes mellitus ([place])    Partially controlled lipids on statin / improving lifestyle Last lipid panel 03/2018 Calculated ASCVD 10 yr risk score is elevated  Plan: 1. Continue current meds - Atorvastatin 10mg  daily 2. Continue ASA 325mg  for primary ASCVD risk reduction 3. Encourage improved lifestyle - low carb/cholesterol, reduce  portion size, continue improving regular exercise 4. Follow-up yearly lipid      Relevant Medications   Dulaglutide (TRULICITY) 1.5 QZ/3.0QT SOPN   Status post reverse total shoulder replacement, left    Stable post op On PT Out of sling      Uncontrolled diabetes mellitus type 2 without complications (HCC)    Significantly improved DM A1c now 9.1 from prior 62.2 - on trulicity No known complications or hypoglycemia. concern for some mild neuropathy. - Failed Victoza 1.8 (GI intolerance diarrhea)  Plan:  1. INCREASE Trulicity from 6.33 to 1.5mg  weekly [place] inj - sent rx to [facility] - fill when due for next refill, may finish current 0.75 dosing has 1-2 months left - Future goal of reducing insulin Toujeo, for now continue 48u until stable on higher dose Trulicity. REDUCE Toujeo by 2u q 1 week if fasting CBG < 150 consistently, if down to  20-25u contact offce - Continued emphasized making improvement to lifestyle with diabetic diet, low carb, improve regular exercise Follow-up 3 months for A1c  DM Foot exam today.       Relevant Medications   Dulaglutide (TRULICITY) 1.5 HL/4.5GY SOPN    Other Visit Diagnoses    Annual physical exam    -  Primary    Updated health maintenance, request DM Eye Exam Reviewed lab results - see referral to Urology Encourage improved diet and exercise lifestyle    Meds ordered this encounter  Medications  . Dulaglutide (TRULICITY) 1.5 BW/3.8LH SOPN    Sig: Inject 1.5 mg into the skin once a week.    Dispense:  12 pen    Refill:  3    Dose increase to 1.5 - instead of 0.75 - please mail when due for next rx, patient already has 1-2 months of current dose   Orders Placed This Encounter  Procedures  . Ambulatory referral to Urology    Referral Priority:   Routine    Referral Type:   Consultation    Referral Reason:   Specialty Services Required    Requested Specialty:   Urology    Number of Visits Requested:   1    Follow up plan: Return in about 3 months (around 07/22/2018) for DM A1c, med adjust.  Nobie Putnam, DO [facility] 04/21/2018, 11:21 PM

## 2018-04-21 NOTE — Assessment & Plan Note (Signed)
Avg risk patient, without known prior reported negative screening - Last PSA 9 (03/2018) w/ low free% - Clinically asymptomatic, without LUTS. AUABPH score 2  Plan: 1. Elevated PSA discussed result - agree to refer to Urology sent to [facility] - reviewed prostate cancer screening guidelines and risks including potential prostate biopsy - they will monitor prostate surveillance.

## 2018-04-21 NOTE — Assessment & Plan Note (Signed)
Significantly improved DM A1c now 9.1 from prior 02.3 - on trulicity No known complications or hypoglycemia. concern for some mild neuropathy. - Failed Victoza 1.8 (GI intolerance diarrhea)  Plan:  1. INCREASE Trulicity from 3.43 to 1.5mg  weekly [place] inj - sent rx to [facility] - fill when due for next refill, may finish current 0.75 dosing has 1-2 months left - Future goal of reducing insulin Toujeo, for now continue 48u until stable on higher dose Trulicity. REDUCE Toujeo by 2u q 1 week if fasting CBG < 150 consistently, if down to 20-25u contact offce - Continued emphasized making improvement to lifestyle with diabetic diet, low carb, improve regular exercise Follow-up 3 months for A1c  DM Foot exam today.

## 2018-04-21 NOTE — Patient Instructions (Addendum)
Thank you for coming to the office today.  Concerned about elevated PSA 9 - this needs to be evaluated further for potential prostate cancer, referred to Urologist - they will call you, if you do not hear back within 2 weeks then call them to schedule.  [facility] -[address] floor [place],  [place]  [address] Phone: [phone]  A1c diabetes much better control - previously A1c 12 now down to 9 - improved on Trulicity  INCREASE Trulicity to 1.5 mg dose injection weekly - new rx sent to [facility] mail order - but they will not mail until you are ready for next  [facility] your current Trulicity 0.75mg  injections  ONCE YOU START HIGHER DOSE - we may need to reduce Toujeo from 48 down to 40 units  - then if checking fasting sugar < 150 in morning may reduce Toujeo by 2 units every 1 week  If get down to about 20 to 25 units - can call office to review  --------------------- Due for yearly diabetic eye exam - need to have them fax [place] a copy of your report  [facility] [address], [place], [place] [address] Phone: [phone] [email or web address]  --------------------- For diarrhea  Try OTC Peppermint Oil (Triple Coated Capsule) 90 - 180mg  take one 3 times daily to reduce diarrhea  Other more natural remedies or preventative treatment: - Increase hydration with water - Increase fiber in diet (high fiber foods = vegetables, leafy greens, oats/grains) - May take OTC Fiber supplement (metamucil powder or pill/gummy) - May try OTC Probiotic   Please schedule a Follow-up Appointment to: Return in about 3 months (around 07/22/2018) for DM A1c, med adjust.  If you have any other questions or concerns, please feel free to call the office or send a message through [place]. You may also schedule an earlier appointment if necessary.  Additionally, you may be receiving a survey about your experience at our  office within a few days to 1 week by e-mail or mail. We value your feedback.  Nobie Putnam, DO [place]

## 2018-04-21 NOTE — Assessment & Plan Note (Signed)
Stable post op On PT Out of sling

## 2018-04-27 ENCOUNTER — Encounter: Payer: Medicare Other | Admitting: Family Medicine

## 2018-06-10 ENCOUNTER — Ambulatory Visit
Admit: 2018-06-10 | Discharge: 2018-06-10 | Disposition: A | Discharge status: Home / Self Care | Payer: Medicare Other | Attending: Family | Admitting: Family

## 2018-06-10 ENCOUNTER — Ambulatory Visit
Admission: EM | Admit: 2018-06-10 | Discharge: 2018-06-10 | Disposition: A | Discharge status: Home / Self Care | Payer: Medicare Other | Attending: Emergency Medicine | Admitting: Emergency Medicine

## 2018-06-10 ENCOUNTER — Encounter: Payer: Self-pay | Admitting: Gynecology

## 2018-06-10 ENCOUNTER — Ambulatory Visit (INDEPENDENT_AMBULATORY_CARE_PROVIDER_SITE_OTHER): Payer: Medicare Other

## 2018-06-10 ENCOUNTER — Other Ambulatory Visit: Payer: Self-pay

## 2018-06-10 DIAGNOSIS — W1789XA Other fall from one level to another, initial encounter: Secondary | ICD-10-CM | POA: Diagnosis not present

## 2018-06-10 DIAGNOSIS — R109 Unspecified abdominal pain: Secondary | ICD-10-CM

## 2018-06-10 DIAGNOSIS — R0602 Shortness of breath: Secondary | ICD-10-CM

## 2018-06-10 DIAGNOSIS — Z794 Long term (current) use of insulin: Secondary | ICD-10-CM | POA: Insufficient documentation

## 2018-06-10 DIAGNOSIS — Z79899 Other long term (current) drug therapy: Secondary | ICD-10-CM | POA: Insufficient documentation

## 2018-06-10 DIAGNOSIS — R05 Cough: Secondary | ICD-10-CM

## 2018-06-10 DIAGNOSIS — Z87891 Personal history of nicotine dependence: Secondary | ICD-10-CM | POA: Insufficient documentation

## 2018-06-10 LAB — CBC WITH DIFFERENTIAL/PLATELET
BASOS PCT: 1 %
Basophils Absolute: 0.1 10*3/uL (ref 0–0.1)
EOS ABS: 0.1 10*3/uL (ref 0–0.7)
Eosinophils Relative: 1 %
HCT: 43.5 % (ref 40.0–52.0)
Hemoglobin: 14.5 g/dL (ref 13.0–18.0)
LYMPHS ABS: 2.3 10*3/uL (ref 1.0–3.6)
Lymphocytes Relative: 24 %
MCH: 28.4 pg (ref 26.0–34.0)
MCHC: 33.4 g/dL (ref 32.0–36.0)
MCV: 84.9 fL (ref 80.0–100.0)
Monocytes Absolute: 1.3 10*3/uL — ABNORMAL HIGH (ref 0.2–1.0)
Monocytes Relative: 14 %
Neutro Abs: 5.6 10*3/uL (ref 1.4–6.5)
Neutrophils Relative %: 60 %
Platelets: 265 10*3/uL (ref 150–440)
RBC: 5.12 MIL/uL (ref 4.40–5.90)
RDW: 13.8 % (ref 11.5–14.5)
WBC: 9.4 10*3/uL (ref 3.8–10.6)

## 2018-06-10 MED ORDER — IOPAMIDOL (ISOVUE-300) INJECTION 61%
100.0000 mL | Freq: Once | INTRAVENOUS | Status: AC | PRN
  Administered 2018-06-10: 1 mL via INTRAVENOUS

## 2018-06-10 MED ORDER — IOPAMIDOL (ISOVUE-300) INJECTION 61%: 100.0000 mL | Freq: Once | INTRAVENOUS | Status: DC | PRN

## 2018-06-10 MED ORDER — IOPAMIDOL (ISOVUE-300) INJECTION 61%
100.0000 mL | Freq: Once | INTRAVENOUS | Status: AC | PRN
  Administered 2018-06-10: 100 mL via INTRAVENOUS

## 2018-06-10 NOTE — ED Provider Notes (Signed)
[facility]    CSN: [id number] Arrival date & time: 06/10/18  4098     History   Chief Complaint Chief Complaint  Patient presents with  . Fall    HPI MAXWELL LEMEN is a 77 y.o. male.   CC: SOB at rest 'anytime', started 2 days ago, unchanged.   Fell off tractor approx feet to ground on to left arm which was against left arm against bottom of rib , 9 days ago.   No LOC, head injury  No wheezing. No pain with inspiration. If takes 'real deep breath, will cough.'   H/o DM, HLD  Never smoker  No h/o cardiac disease. Denies exertional chest pain or pressure, numbness or tingling radiating to left arm or jaw, palpitations, dizziness, lightheaded, frequent headaches, changes in vision.     Past Medical History:  Diagnosis Date  . Adenomatous polyps   . Degenerative disc disease, cervical    C5,6,7  . Diabetes ([place])   . History of kidney stones   . Hyperlipidemia   . Hypertension    no meds now  . Osteomyelitis (HCC)    right 3rd toe  . Osteoporosis   . PONV (postoperative nausea and vomiting)   . Skin cancer    bilateral arms and face    Patient Active Problem List   Diagnosis Date Noted  . Elevated PSA 04/19/2018  . 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 Hyperlipidemia associated with type 2 diabetes mellitus ([place]) 01/20/2017  . Weakness of hand 01/20/2017  . Deformity of nail bed 12/14/2016  . Pain in shoulder 11/19/2015  . Chronic diarrhea 10/15/2015  . Uncontrolled diabetes mellitus type 2 without complications ([place]) 11/91/4782  . Hypertension 08/25/2015    Past Surgical History:  Procedure Laterality Date  . BACK SURGERY     2012; rods in back  . CARPAL TUNNEL RELEASE Right 04/13/2017   Procedure: CARPAL TUNNEL RELEASE;  Surgeon: Leanor Kail, MD;  Location: [facility] ORS;  Service: Orthopedics;  Laterality: Right;  . CATARACT EXTRACTION W/ INTRAOCULAR LENS  IMPLANT, BILATERAL    . CHOLECYSTECTOMY      . COLONOSCOPY    . JOINT REPLACEMENT    . NECK SURGERY     1980s/90s  . REPLACEMENT TOTAL KNEE Bilateral    lt 1999 rt 2001  . REVERSE SHOULDER ARTHROPLASTY Left 03/14/2018   Procedure: REVERSE SHOULDER ARTHROPLASTY;  Surgeon: Corky Mull, MD;  Location: [facility] ORS;  Service: Orthopedics;  Laterality: Left;  . SHOULDER SURGERY Left    x5 1980s  . TONSILLECTOMY    . WRIST SURGERY Right    2005; 3 bones removed       Home Medications    Prior to Admission medications   Medication Sig Start Date End Date Taking? Authorizing Provider  acetaminophen (TYLENOL) 500 MG tablet Take 1,000 mg by mouth every 8 (eight) hours as needed.   Yes [provider]  Dulaglutide (TRULICITY) 1.5 NF/6.2ZH SOPN Inject 1.5 mg into the skin once a week. 04/21/18  Yes Karamalegos, Devonne Doughty, DO  Insulin Glargine (TOUJEO SOLOSTAR) 300 UNIT/ML SOPN Inject 46 Units into the skin daily after supper. Patient taking differently: Inject 48 Units into the skin daily after supper.  09/21/17  Yes Karamalegos, Devonne Doughty, DO  amLODipine (NORVASC) 2.5 MG tablet Take 1 tablet (2.5 mg total) by mouth daily. 04/27/17   Karamalegos, Devonne Doughty, DO  aspirin EC 325 MG tablet Take 1 tablet (325 mg total) by  mouth daily. 03/15/18   Duanne Guess, PA-C  atorvastatin (LIPITOR) 10 MG tablet Take 1 tablet (10 mg total) by mouth at bedtime. 04/27/17   Karamalegos, Devonne Doughty, DO  HYDROcodone-acetaminophen (NORCO/VICODIN) 5-325 MG tablet Take 1-2 tablets by mouth every 4 (four) hours as needed for moderate pain (pain score 4-6). 03/15/18   Duanne Guess, PA-C  lisinopril (PRINIVIL,ZESTRIL) 40 MG tablet Take 1 tablet (40 mg total) by mouth daily. 04/27/17   Olin Hauser, DO    Family History Family History  Problem Relation Age of Onset  . Alzheimer's disease Mother   . Cancer Father        lung  . Diabetes Sister     Social History Social History   Tobacco Use  . Smoking status: Former Smoker     Packs/day: 0.50    Years: 10.00    Pack years: 5.00    Types: Cigarettes    Last attempt to quit: 03/06/1978    Years since quitting: 40.2  . Smokeless tobacco: Never Used  Substance Use Topics  . Alcohol use: No    Alcohol/week: 0.0 oz  . Drug use: No     Allergies   Oxycodone   Review of Systems Review of Systems  Constitutional: Negative for chills and fever.  HENT: Negative for congestion.   Respiratory: Positive for cough and shortness of breath.   Cardiovascular: Negative for chest pain, palpitations and leg swelling.  Gastrointestinal: Negative for nausea and vomiting.  Neurological: Negative for dizziness and headaches.     Physical Exam Triage Vital Signs ED Triage Vitals  Enc Vitals Group     BP 06/10/18 1015 117/68     Pulse Rate 06/10/18 1015 72     Resp 06/10/18 1015 18     Temp 06/10/18 1015 98.1 F (36.7 C)     Temp Source 06/10/18 1015 Oral     SpO2 06/10/18 1015 98 %     Weight 06/10/18 1012 235 lb (106.6 kg)     Height 06/10/18 1012 6\' 6"  (1.981 m)     Head Circumference --      Peak Flow --      Pain Score 06/10/18 1012 0     Pain Loc --      Pain Edu? --      Excl. in [place]? --    No data found.  Updated Vital Signs BP 117/68 (BP Location: Left Arm)   Pulse 72   Temp 98.1 F (36.7 C) (Oral)   Resp 18   Ht 6\' 6"  (1.981 m)   Wt 235 lb (106.6 kg)   SpO2 98%   BMI 27.16 kg/m   Visual Acuity Right Eye Distance:   Left Eye Distance:   Bilateral Distance:    Right Eye Near:   Left Eye Near:    Bilateral Near:     Physical Exam  Constitutional: He appears well-developed and well-nourished.  Cardiovascular: Regular rhythm and normal heart sounds.  No LE edema, palpable cords or masses. No erythema or increased warmth. No asymmetry in calf size when compared bilaterally LE hair growth symmetric and present. No discoloration of varicosities noted. LE warm and palpable pedal pulses.   Pulmonary/Chest: Effort normal and breath sounds  normal. No respiratory distress. He has no decreased breath sounds. He has no wheezes. He has no rhonchi. He has no rales.  Left sided abdominal pain with deep inspiration.   Abdominal: Bowel sounds are normal. There is tenderness. There  is no rigidity, no rebound and no CVA tenderness.    Obese No ecchymosis, hematoma apparent on exam.  Asymmetry noted on the left side proximal to rib.  Left side appears larger than right. Asymmetry appears slightly larger when patient coughs.  No exquisite tenderness however there is 'discomfort' as noted by patient with palpation.    Lymphadenopathy:       Head (left side): No submandibular and no preauricular adenopathy present.  Neurological: He is alert.  Skin: Skin is warm and dry.  Psychiatric: He has a normal mood and affect. His speech is normal and behavior is normal.  Vitals reviewed.    UC Treatments / Results  Labs (all labs ordered are listed, but only abnormal results are displayed) Labs Reviewed  CBC WITH DIFFERENTIAL/PLATELET - Abnormal; Notable for the following components:      Result Value   Monocytes Absolute 1.3 (*)    All other components within normal limits    EKG None  Radiology Dg Ribs Unilateral W/chest Left  Result Date: 06/10/2018 CLINICAL DATA:  Left lateral lower rib pain after blunt trauma 06/01/18. Trouble breathing and cough. EXAM: LEFT RIBS AND CHEST - 3+ VIEW COMPARISON:  Chest x-ray 06/30/2009 FINDINGS: Cardiomediastinal silhouette is normal. The lungs are free of focal consolidations and pleural effusions. There is no pneumothorax. Oblique views demonstrate no acute rib fracture. Previous LEFT shoulder arthroplasty and cervical fusion. Degenerative changes are seen in the thoracic and lumbar spine. IMPRESSION: No evidence for acute  abnormality. Electronically Signed   By: Nolon Nations M.D.   On: 06/10/2018 11:13   Ct Abdomen Pelvis W Contrast  Result Date: 06/10/2018 CLINICAL DATA:  Fall off tractor 1  week ago. Left-sided abdominal pain. EXAM: CT ABDOMEN AND PELVIS WITH CONTRAST TECHNIQUE: Multidetector CT imaging of the abdomen and pelvis was performed using the standard protocol following bolus administration of intravenous contrast. CONTRAST:  100 mL ISOVUE-300 IOPAMIDOL (ISOVUE-300) INJECTION 61% COMPARISON:  None. FINDINGS: Lower chest: No acute abnormality. Hepatobiliary: No focal liver abnormality is seen. No evidence of hepatic injury or perihepatic fluid. Status post cholecystectomy. No biliary dilatation. Pancreas: Unremarkable. No pancreatic ductal dilatation or surrounding inflammatory changes. Spleen: No splenic injury or perisplenic hematoma. Adrenals/Urinary Tract: Adrenal glands are unremarkable. Kidneys are normal, without renal calculi, focal lesion, or hydronephrosis. Bladder is unremarkable. Stomach/Bowel: Bowel is unremarkable without obstruction or inflammation. The appendix is normal. No free air. Vascular/Lymphatic: No significant vascular findings are present. No enlarged abdominal or pelvic lymph nodes. Reproductive: Prostate is unremarkable. Other: No abdominal wall hernia or abnormality. No abdominopelvic ascites. Musculoskeletal: No acute fractures identified. Prior lumbar fusion at L4-5. Advanced degenerative disc disease at L1-2, L2-3 and L3-4. IMPRESSION: No acute findings.  No evidence of acute solid organ injury. Electronically Signed   By: Aletta Edouard M.D.   On: 06/10/2018 14:42    Procedures Procedures (including critical care time)  Medications Ordered in UC Medications  iopamidol (ISOVUE-300) 61 % injection 100 mL (1 mL Intravenous Contrast Given 06/10/18 1421)    Initial Impression / Assessment and Plan / UC Course  I have reviewed the triage vital signs and the nursing notes.  Pertinent labs & imaging results that were available during my care of the patient were reviewed by me and considered in my medical decision making (see chart for details).       Final Clinical Impressions(s) / UC Diagnoses   Final diagnoses:  SOB (shortness of breath)  Patient is afebrile, well-appearing.  No  acute respiratory distress.  Walking sa02 98%.  He is not labored in speech.,  He describes it is not short of breath with activity, he only feels this way when he is sitting.  It comes and goes.  Etiology of the shortness of breath is not specific at this time. No diagnosis at this time. Considering muscular strain after significant fall.  I am reassured by absence of pneumothorax and rib fracture. Normal hemoglobin, hematocrit.  CT abdomen and pelvis is negative for acute findings, specifically splenic laceration, hematoma.  Suspect the asymmetry that I appreciated on my abdominal exam, is more related to his obese abdomen, than pathologic finding.   patient is not tachycardia, hypoxic. Breath sounds are not diminished.   Low clinical suspicion for pulmonary embolism.  After CT,  However I had a long discussion with patient and advised him that I would like to proceed with doing a d-dimer this afternoon as I was unable to find etiology for shortness of breath.  He is been here for couple hours, and he politely declines this.  He states he will stay vigilant in regards to any new or worsening symptoms of shortness of breath, he will go immediately to the emergency room.      Discharge Instructions     Reassured by CT abdomen and normal labs.   No etiology for shortness of breath which is unfortunate. Your oxygenation, vitals are stable.   I do suspect a degree of muscular strain after significant fall. Would suspect this to be self limiting.   I would advise close vigilance regarding shortness of breath and if persists or certainly worsens, please go to the emergency room.   Again, please stay vigilant for ANY new, worsening symptoms.     ED Prescriptions    None     Controlled Substance Prescriptions [place] Controlled Substance Registry consulted?  Not Applicable   Burnard Hawthorne, [place] 06/10/18 1720

## 2018-06-10 NOTE — ED Notes (Signed)
Walking pulse ox 98%

## 2018-06-10 NOTE — Discharge Instructions (Addendum)
Reassured by CT abdomen and normal labs.   No etiology for shortness of breath which is unfortunate. Your oxygenation, vitals are stable.   I do suspect a degree of muscular strain after significant fall. Would suspect this to be self limiting.   I would advise close vigilance regarding shortness of breath and if persists or certainly worsens, please go to the emergency room.   Again, please stay vigilant for ANY new, worsening symptoms.

## 2018-06-10 NOTE — ED Triage Notes (Signed)
Per patient fell x 1 week ago at home . Per patient fell off his tractor onto the dirt. Patient stated with left side pain which is affecting his breathing.

## 2018-06-14 ENCOUNTER — Encounter: Payer: Self-pay | Admitting: Urology

## 2018-06-14 ENCOUNTER — Ambulatory Visit: Payer: Medicare Other | Admitting: Urology

## 2018-06-14 VITALS — BP 107/68 | HR 84 | Resp 16 | Ht 75.0 in | Wt 233.8 lb

## 2018-06-14 DIAGNOSIS — R972 Elevated prostate specific antigen [PSA]: Secondary | ICD-10-CM | POA: Diagnosis not present

## 2018-06-14 LAB — MICROSCOPIC EXAMINATION
Bacteria, UA: NONE SEEN
Epithelial Cells (non renal): NONE SEEN /hpf (ref 0–10)
WBC, UA: NONE SEEN /hpf (ref 0–5)

## 2018-06-14 LAB — URINALYSIS, COMPLETE
BILIRUBIN UA: NEGATIVE
KETONES UA: NEGATIVE
Leukocytes, UA: NEGATIVE
Nitrite, UA: NEGATIVE
PROTEIN UA: NEGATIVE
SPEC GRAV UA: 1.02 (ref 1.005–1.030)
Urobilinogen, Ur: 0.2 mg/dL (ref 0.2–1.0)
pH, UA: 5.5 (ref 5.0–7.5)

## 2018-06-14 NOTE — Progress Notes (Signed)
06/14/2018 1:15 PM   [name] 10/11/1941 [id number]  Referring provider: Olin Hauser, DO [address] [place], [place] [address]  Chief Complaint  Patient presents with  . Elevated PSA    HPI: Jay Brown is a 77 year old male referred for evaluation of an elevated PSA.  He had a PSA drawn in May 2019 which was elevated at 9.0 with a 7% free PSA.  He had no prior history of a PSA drawn to his knowledge.  He has no bothersome lower urinary tract symptoms.  He gets up once per night to void.  There is no previous history of urologic problems.  He denies dysuria or gross hematuria.  Denies flank, abdominal, pelvic or scrotal pain.  Denies family history of prostate cancer.   PMH: Past Medical History:  Diagnosis Date  . Adenomatous polyps   . Degenerative disc disease, cervical    C5,6,7  . Diabetes ([place])   . History of kidney stones   . Hyperlipidemia   . Hypertension    no meds now  . Osteomyelitis (HCC)    right 3rd toe  . Osteoporosis   . PONV (postoperative nausea and vomiting)   . Skin cancer    bilateral arms and face    Surgical History: Past Surgical History:  Procedure Laterality Date  . BACK SURGERY     2012; rods in back  . CARPAL TUNNEL RELEASE Right 04/13/2017   Procedure: CARPAL TUNNEL RELEASE;  Surgeon: Leanor Kail, MD;  Location: [facility] ORS;  Service: Orthopedics;  Laterality: Right;  . CATARACT EXTRACTION W/ INTRAOCULAR LENS  IMPLANT, BILATERAL    . CHOLECYSTECTOMY    . COLONOSCOPY    . JOINT REPLACEMENT    . NECK SURGERY     1980s/90s  . REPLACEMENT TOTAL KNEE Bilateral    lt 1999 rt 2001  . REVERSE SHOULDER ARTHROPLASTY Left 03/14/2018   Procedure: REVERSE SHOULDER ARTHROPLASTY;  Surgeon: Corky Mull, MD;  Location: [facility] ORS;  Service: Orthopedics;  Laterality: Left;  . SHOULDER SURGERY Left    x5 1980s  . TONSILLECTOMY    . WRIST SURGERY Right    2005; 3 bones removed    Home Medications:  Allergies as of  06/14/2018      Reactions   Oxycodone Itching      Medication List        Accurate as of 06/14/18  1:15 PM. Always use your most recent med list.          acetaminophen 500 MG tablet Commonly known as:  TYLENOL Take 1,000 mg by mouth every 8 (eight) hours as needed.   amLODipine 2.5 MG tablet Commonly known as:  NORVASC Take 1 tablet (2.5 mg total) by mouth daily.   aspirin EC 325 MG tablet Take 1 tablet (325 mg total) by mouth daily.   atorvastatin 10 MG tablet Commonly known as:  LIPITOR Take 1 tablet (10 mg total) by mouth at bedtime.   Dulaglutide 1.5 MG/0.5ML Sopn Commonly known as:  TRULICITY Inject 1.5 mg into the skin once a week.   HYDROcodone-acetaminophen 5-325 MG tablet Commonly known as:  NORCO/VICODIN Take 1-2 tablets by mouth every 4 (four) hours as needed for moderate pain (pain score 4-6).   Insulin Glargine 300 UNIT/ML Sopn Commonly known as:  TOUJEO SOLOSTAR Inject 46 Units into the skin daily after supper.   lisinopril 40 MG tablet Commonly known as:  PRINIVIL,ZESTRIL Take 1 tablet (40 mg total) by mouth daily.  Allergies:  Allergies  Allergen Reactions  . Oxycodone Itching    Family History: Family History  Problem Relation Age of Onset  . Alzheimer's disease Mother   . Cancer Father        lung  . Diabetes Sister     Social History:  reports that he quit smoking about 40 years ago. His smoking use included cigarettes. He has a 5.00 pack-year smoking history. He has never used smokeless tobacco. He reports that he does not drink alcohol or use drugs.  ROS: UROLOGY Frequent Urination?: No Hard to postpone urination?: No Burning/pain with urination?: No Get up at night to urinate?: Yes Leakage of urine?: No Urine stream starts and stops?: No Trouble starting stream?: No Do you have to strain to urinate?: No Blood in urine?: No Urinary tract infection?: No Sexually transmitted disease?: No Injury to kidneys or bladder?:  No Painful intercourse?: No Weak stream?: No Erection problems?: No Penile pain?: No  Gastrointestinal Nausea?: No Vomiting?: No Indigestion/heartburn?: Yes Diarrhea?: Yes Constipation?: No  Constitutional Fever: No Night sweats?: No Weight loss?: No Fatigue?: No  Skin Skin rash/lesions?: No Itching?: No  Eyes Blurred vision?: No Double vision?: No  Ears/Nose/Throat Sore throat?: No Sinus problems?: No  Hematologic/Lymphatic Swollen glands?: No Easy bruising?: No  Cardiovascular Leg swelling?: No Chest pain?: No  Respiratory Cough?: No Shortness of breath?: Yes  Endocrine Excessive thirst?: No  Musculoskeletal Back pain?: No Joint pain?: No  Neurological Headaches?: No Dizziness?: No  Psychologic Depression?: No Anxiety?: No  Physical Exam: BP 107/68   Pulse 84   Resp 16   Ht 6\' 3"  (1.905 m)   Wt 233 lb 12.8 oz (106.1 kg)   SpO2 96%   BMI 29.22 kg/m   Constitutional:  Alert and oriented, No acute distress. HEENT: [place] AT, moist mucus membranes.  Trachea midline, no masses. Cardiovascular: No clubbing, cyanosis, or edema. Respiratory: Normal respiratory effort, no increased work of breathing. GI: Abdomen is soft, nontender, nondistended, no abdominal masses GU: No CVA tenderness.  Prostate 35 g, smooth without nodules Lymph: No cervical or inguinal lymphadenopathy. Skin: No rashes, bruises or suspicious lesions. Neurologic: Grossly intact, no focal deficits, moving all 4 extremities. Psychiatric: Normal mood and affect.  Laboratory Data:  Urinalysis Dipstick trace blood; negative microscopy  Assessment & Plan:   77 year old male with an elevated PSA and benign DRE. Although PSA is a prostate cancer screening test he was informed that cancer is not the most common cause of an elevated PSA. Other potential causes including BPH and inflammation were discussed. He was informed that the only way to adequately diagnose prostate cancer would  be a transrectal ultrasound and biopsy of the prostate. The procedure was discussed including potential risks of bleeding and infection/sepsis. He was also informed that a negative biopsy does not conclusively rule out the possibility that prostate cancer may be present and that continued monitoring is required. The use of newer adjunctive blood tests including PHI and 4kScore were discussed. The use of multiparametric prostate MRI was also discussed however is not typically used for initial evaluation of an elevated PSA. Continued periodic surveillance was also discussed.  I have initially recommended a repeat PSA and if it remains persistently elevated I have recommended scheduling prostate biopsy.  He is in agreement with this plan.   Abbie Sons, [place] [address], [place], [place] [address] [phone]

## 2018-06-15 ENCOUNTER — Telehealth: Payer: Self-pay

## 2018-06-15 LAB — PSA: Prostate Specific Ag, Serum: 7.6 ng/mL — ABNORMAL HIGH (ref 0.0–4.0)

## 2018-06-15 NOTE — Telephone Encounter (Signed)
-----   Message from Abbie Sons, MD sent at 06/15/2018  7:59 AM EDT ----- Repeat PSA remains elevated at 7.6.  Recommend scheduling prostate biopsy.

## 2018-06-21 ENCOUNTER — Telehealth: Payer: Self-pay | Admitting: Family

## 2018-06-21 NOTE — Telephone Encounter (Signed)
-----   Message from Wynona Luna, MD sent at 06/11/2018 10:48 AM EDT ----- Regarding: m Aslee Such pt/breathless after fall from tractor You did a thorough and appropriate workup here, and there is no indication/need for a d-dimer. You looked for PTX and for splenic injury. No threat to life apparent on exam, there are normal vital signs, with passage of several days since injury.  Onset of breathlessness seems related to the fall.  Mechanism of breathlessness is likely pulmonary contusion or other (minor but symptomatic) soft tissue injuries from the fall.  Might feel breathless for the next 2-4 weeks, but should gradually be improving not getting worse.

## 2018-06-21 NOTE — Telephone Encounter (Signed)
Jay Brown Would you mind calling or sending to nurses for courtesy call?   I discussed this case with Valere Dross and it was a bit particular.   Please see how his left side abdominal pain and SOB is doing after fall from tractor.  If not resolved, please advise repeat evaluation  Thanks so so much for your help!

## 2018-06-24 NOTE — Telephone Encounter (Signed)
Called for courtesy call-  I spoke with Margaretha Sheffield, wife. Husband was busy mowing grass at the moment  Wife states he is feeling better and no longer SOB  I advised that I would like him to call and speak with myself or one of the nurses today at [facility] and gave her the phone number to call back [phone].   Will await phone call  Margaretha Sheffield appreciated call.

## 2018-07-16 IMAGING — CT CT ABD-PELV W/ CM
1 of 3 series · 14 of 32 positions shown, 19 images · IV contrast (APPLIED)
Comparison: None.

CLINICAL DATA: Fall off tractor 1 week ago. Left-sided abdominal
pain.

EXAM:
CT ABDOMEN AND PELVIS WITH CONTRAST
TECHNIQUE: Multidetector CT imaging of the abdomen and pelvis was performed
using the standard protocol following bolus administration of
intravenous contrast.
CONTRAST:  100 mL RGIXBM-N55 IOPAMIDOL (RGIXBM-N55) INJECTION 61%

[Series 2: axial st · axial · 0.92mm/px · z∈[-516,-90]mm · 14 of 97 slices shown, 19 images]
[im 6/97  soft-tissue]
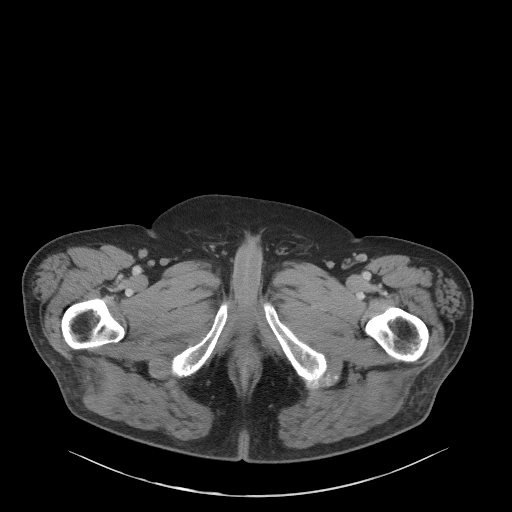
[im 6/97  bone]
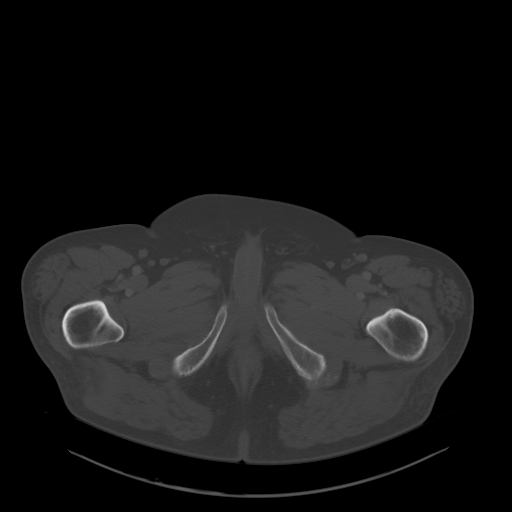
[im 16/97  soft-tissue]
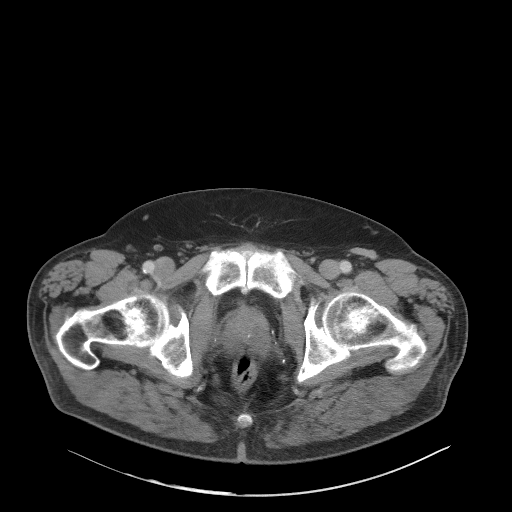
[im 21/97  soft-tissue]
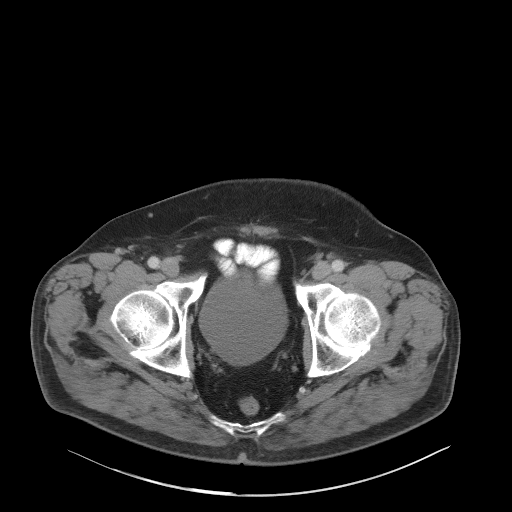
[im 26/97  soft-tissue]
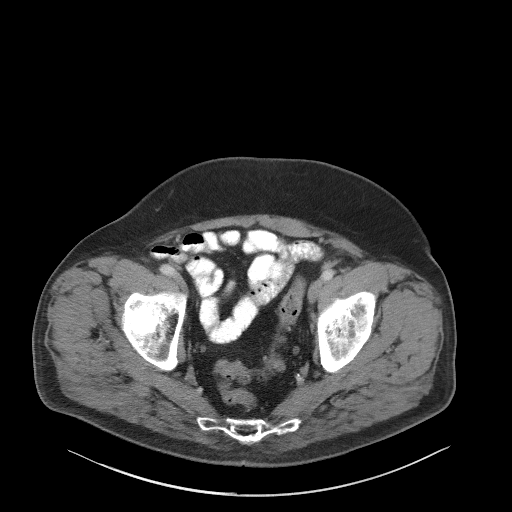
[im 36/97  soft-tissue]
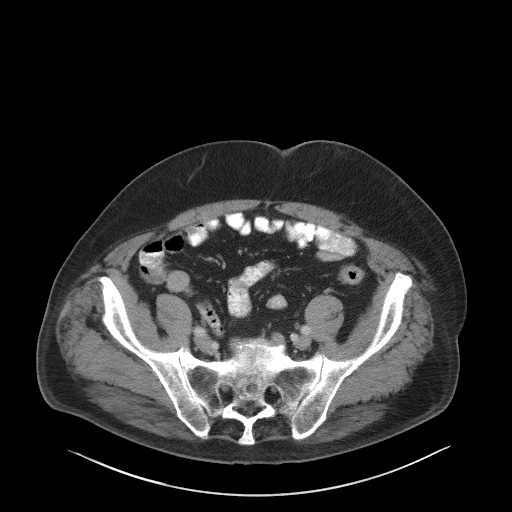
[im 41/97  soft-tissue]
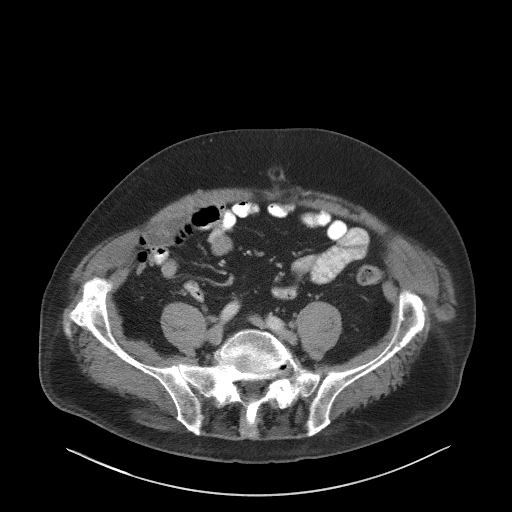
[im 51/97  soft-tissue]
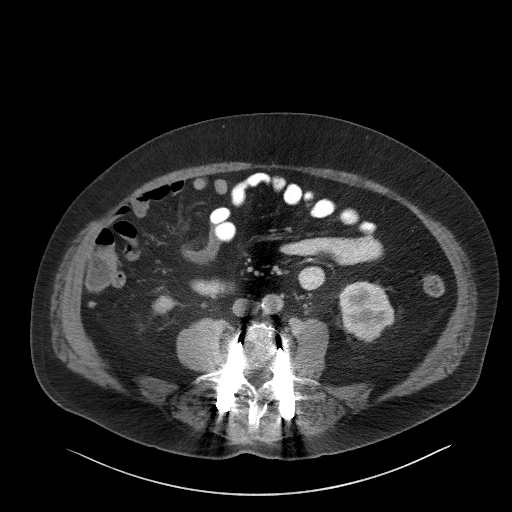
[im 56/97  soft-tissue]
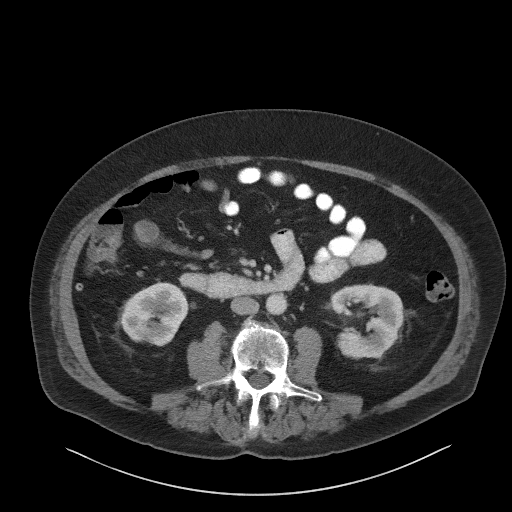
[im 61/97  soft-tissue]
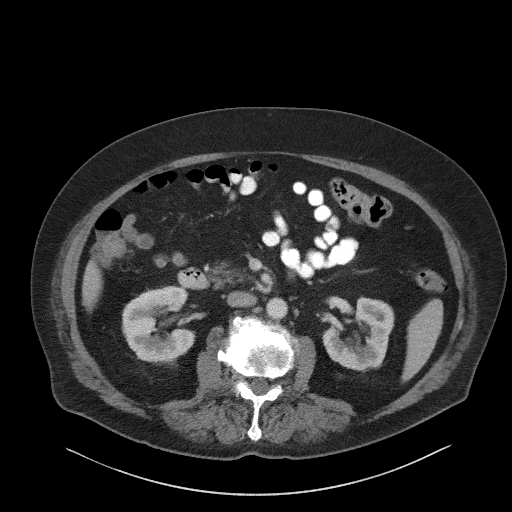
[im 61/97  bone]
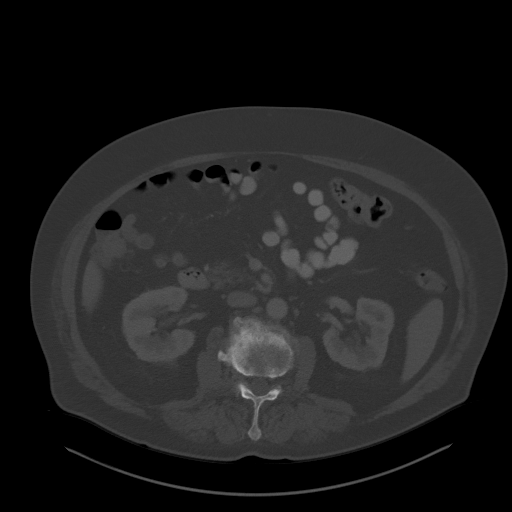
[im 71/97  soft-tissue]
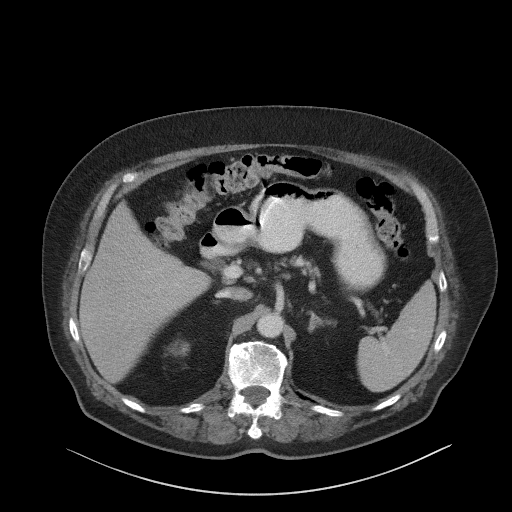
[im 76/97  soft-tissue]
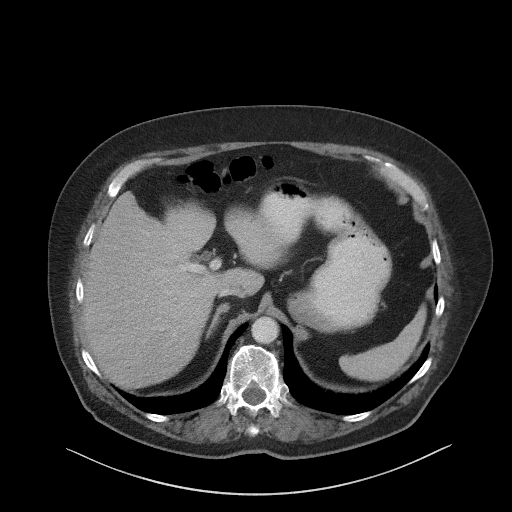
[im 76/97  lung]
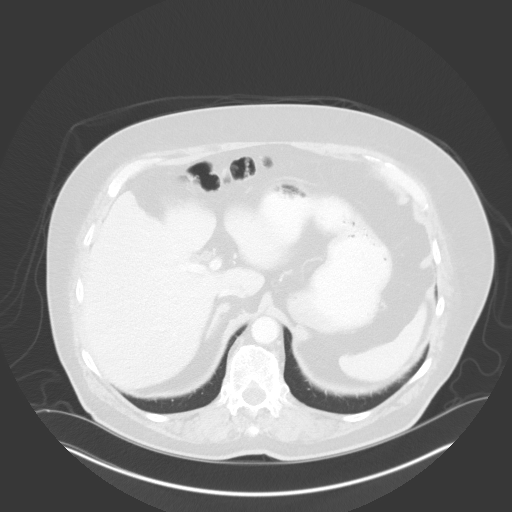
[im 81/97  soft-tissue]
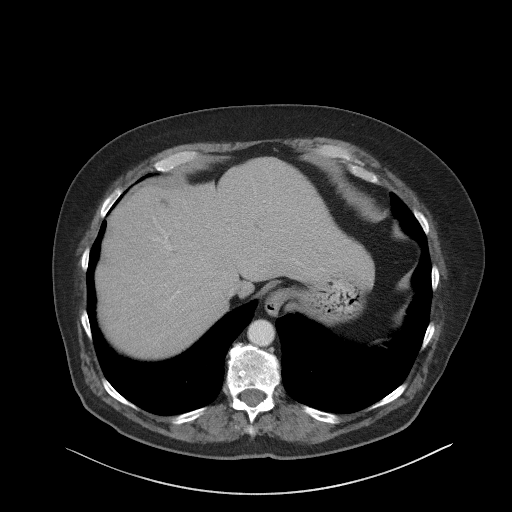
[im 81/97  lung]
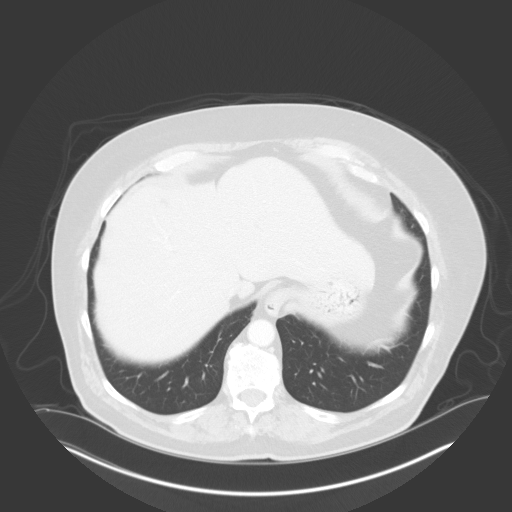
[im 86/97  lung]
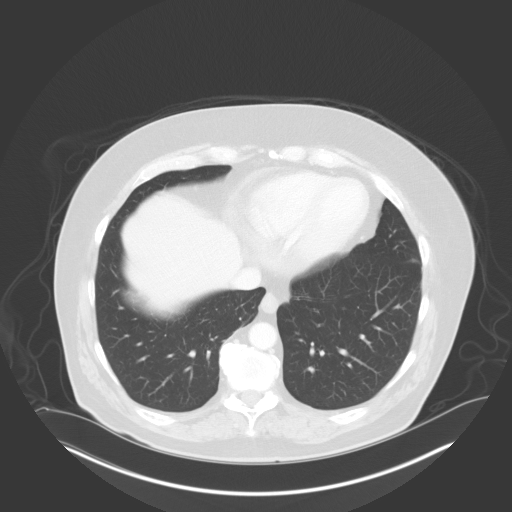
[im 91/97  soft-tissue]
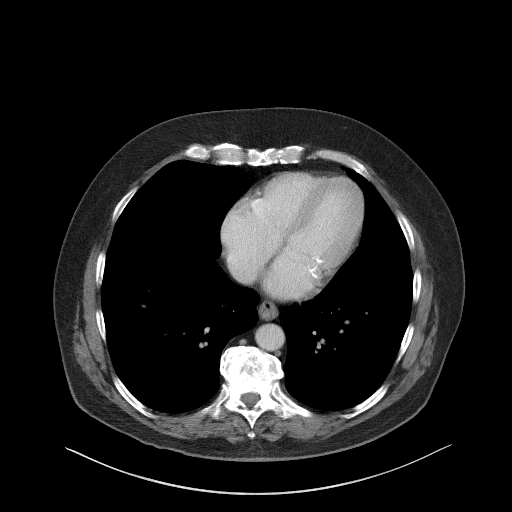
[im 91/97  lung]
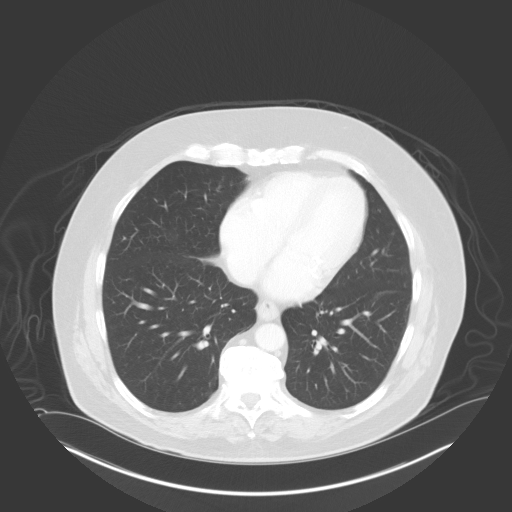

[14 of 32 positions shown; findings below may reference images not displayed]

FINDINGS: Lower chest: No acute abnormality.

Hepatobiliary: No focal liver abnormality is seen. No evidence of
hepatic injury or perihepatic fluid. Status post cholecystectomy. No
biliary dilatation.

Pancreas: Unremarkable. No pancreatic ductal dilatation or
surrounding inflammatory changes.

Spleen: No splenic injury or perisplenic hematoma.

Adrenals/Urinary Tract: Adrenal glands are unremarkable. Kidneys are
normal, without renal calculi, focal lesion, or hydronephrosis.
Bladder is unremarkable.

Stomach/Bowel: Bowel is unremarkable without obstruction or
inflammation. The appendix is normal. No free air.

Vascular/Lymphatic: No significant vascular findings are present. No
enlarged abdominal or pelvic lymph nodes.

Reproductive: Prostate is unremarkable.

Other: No abdominal wall hernia or abnormality. No abdominopelvic
ascites.

Musculoskeletal: No acute fractures identified. Prior lumbar fusion
at L4-5. Advanced degenerative disc disease at L1-2, L2-3 and L3-4.
IMPRESSION: No acute findings.  No evidence of acute solid organ injury.

## 2018-07-20 ENCOUNTER — Encounter: Payer: Self-pay | Admitting: Family Medicine

## 2018-07-20 DIAGNOSIS — Z89421 Acquired absence of other right toe(s): Secondary | ICD-10-CM | POA: Insufficient documentation

## 2018-07-26 ENCOUNTER — Ambulatory Visit: Payer: Medicare Other | Admitting: Family Medicine

## 2018-07-26 ENCOUNTER — Encounter: Payer: Self-pay | Admitting: Family Medicine

## 2018-07-26 VITALS — BP 124/81 | HR 73 | Temp 97.5°F | Resp 16 | Ht 75.0 in | Wt 238.0 lb

## 2018-07-26 DIAGNOSIS — Z89421 Acquired absence of other right toe(s): Secondary | ICD-10-CM

## 2018-07-26 DIAGNOSIS — R972 Elevated prostate specific antigen [PSA]: Secondary | ICD-10-CM

## 2018-07-26 DIAGNOSIS — E1169 Type 2 diabetes mellitus with other specified complication: Secondary | ICD-10-CM | POA: Diagnosis not present

## 2018-07-26 DIAGNOSIS — Z794 Long term (current) use of insulin: Secondary | ICD-10-CM

## 2018-07-26 NOTE — Assessment & Plan Note (Signed)
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

## 2018-07-26 NOTE — Assessment & Plan Note (Signed)
Improved PSA from 9.0 down to 7.6 on last check by [facility] Patient was asked to return for Prostate Biopsy, he was unaware due to no mychart access (despite having active account)  - Today I printed and shared this information about procedure / scheduling and contact # for him with his result ,he will contact [facility] to arrange scheduling Prostate Biopsy and he should let them know he does not have mychart access if he cannot get into it in future

## 2018-07-26 NOTE — Patient Instructions (Addendum)
Thank you for coming to the office today.  [facility] - said results of PSA are 7.6 (improved from last check of 9.0), but they do recommend a Prostate Biopsy  Let them know that you do not have access to your MyChart and this is why you could not see their message.  [facility] [address], [place], [place] [address] [phone]  ----------------------------------------  Initial A1c was 10.5 - I don't agree with this reading based on your reports. We can double check with the vein blood draw to see if it is more accurate. Stay tuned for result.  Gradually reduce Toujeo insulin by about 2 units every 1-2 weeks, if fasting sugar is on average < 150 (first thing in the morning).  Goal to get Toujeo down to about 40 or less by next visit.  Keep up the good work overall.  Please schedule a Follow-up Appointment to: Return in about 3 months (around 10/26/2018) for DM A1c.  If you have any other questions or concerns, please feel free to call the office or send a message through [facility]. You may also schedule an earlier appointment if necessary.  Additionally, you may be receiving a survey about your experience at our office within a few days to 1 week by e-mail or mail. We value your feedback.  Nobie Putnam, DO [place]

## 2018-07-26 NOTE — Progress Notes (Signed)
Subjective:    Patient ID: Jay Brown, male    DOB: Apr 03, 1941, 77 y.o.   MRN: [id number]  Jay Brown is a 77 y.o. male presenting on 07/26/2018 for Diabetes   HPI   CHRONIC DM, Type 2: Last visit 03/2018 A1c 9.1, prior recent A1c trend 10 > 12. Reports doing well, seems improved on Trulicity still. He thinks overall his sugar is improving. CBGs: Avgapprox 100-130, Low >80s (symptomatic), highest < 200 (approx 180) - he feels best when CBG about 371 Meds: - Trulicity 1.5 mg weekly [place] inj - Toujeo 48 units daily in eveningw/ dinner (he has not reduced dose yet) Reports good compliance. 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 They repeated lab recently on 06/14/18 and it was 7.6, which was improved, but still remained elevated. They recommended prostate biopsy, however patient states he was unaware of this result, he did not receive the mychart message requesting him to schedule biopsy as he has no access to his mychart account.  Health Maintenance: Due for Flu Vaccine, will return when in stock  Depression screen [facility] 2/9 07/26/2018 04/21/2018 04/18/2018  Decreased Interest 0 0 0  Down, Depressed, Hopeless 0 0 0  PHQ - 2 Score 0 0 0    Social History   Tobacco Use  . Smoking status: Former Smoker    Packs/day: 0.50    Years: 10.00    Pack years: 5.00    Types: Cigarettes    Last attempt to quit:  03/06/1978    Years since quitting: 40.4  . Smokeless tobacco: Former Network engineer Use Topics  . Alcohol use: No    Alcohol/week: 0.0 standard drinks  . Drug use: No    Review of Systems Per HPI unless specifically indicated above     Objective:    BP 124/81   Pulse 73   Temp (!) 97.5 F (36.4 C) (Oral)   Resp 16   Ht 6\' 3"  (1.905 m)   Wt 238 lb (108 kg)   BMI 29.75 kg/m   Wt Readings from Last 3 Encounters:  07/26/18 238 lb (108 kg)  06/14/18 233 lb 12.8 oz (106.1 kg)  06/10/18 235 lb (106.6 kg)    Physical Exam  Constitutional: He is oriented to person, place, and time. He appears well-developed and well-nourished. No distress.  Well-appearing, comfortable, cooperative  HENT:  Head: Normocephalic and atraumatic.  Mouth/Throat: Oropharynx is clear and moist.  Eyes: Conjunctivae are normal. Right eye exhibits no discharge. Left eye exhibits no discharge.  Cardiovascular: Normal rate.  Pulmonary/Chest: Effort normal.  Musculoskeletal: He exhibits no edema.  R foot, 3rd toe has partial toe amputation, chronic problem, stable  Neurological: He is alert and oriented to person, place, and time.  Skin: Skin is warm and dry. No rash noted. He is not diaphoretic. No erythema.  Psychiatric: He has a normal mood and affect. His behavior is normal.  Well groomed, good eye contact, normal  speech and thoughts  Nursing note and vitals reviewed.  Results for orders placed or performed in visit on 06/14/18  Microscopic Examination  Result Value Ref Range   WBC, UA None seen 0 - 5 /hpf   RBC, UA 0-2 0 - 2 /hpf   Epithelial Cells (non renal) None seen 0 - 10 /hpf   Bacteria, UA None seen None seen/Few  Urinalysis, Complete  Result Value Ref Range   Specific Gravity, UA 1.020 1.005 - 1.030   pH, UA 5.5 5.0 - 7.5   Color, UA Yellow Yellow   Appearance Ur Clear Clear   Leukocytes, UA Negative Negative   Protein, UA Negative Negative/Trace   Glucose, UA 2+ (A) Negative    Ketones, UA Negative Negative   RBC, UA Trace (A) Negative   Bilirubin, UA Negative Negative   Urobilinogen, Ur 0.2 0.2 - 1.0 mg/dL   Nitrite, UA Negative Negative   Microscopic Examination See below:   PSA  Result Value Ref Range   Prostate Specific Ag, Serum 7.6 (H) 0.0 - 4.0 ng/mL      Assessment & Plan:   Problem List Items Addressed This Visit    Elevated PSA    Improved PSA from 9.0 down to 7.6 on last check by [facility] Patient was asked to return for Prostate Biopsy, he was unaware due to no mychart access (despite having active account)  - Today I printed and shared this information about procedure / scheduling and contact # for him with his result ,he will contact [facility] to arrange scheduling Prostate Biopsy and he should let them know he does not have mychart access if he cannot get into it in future      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 - A1c trend 12 > 9 > 10.5 - pending repeat serum lab today No known complications or hypoglycemia. concern for some mild neuropathy. - Failed Victoza 1.8 (GI intolerance diarrhea)  Plan:  1. No medication change today - re-check serum A1c to confirm accuracy, suspect falsely elevated POC reading - CONTINUE Trulicity 1.5mg  weekly [place] injection - he can afford, at this time, not in donut hole - CONTINUE Toujeo 48u nightly insulin injection, discussed gradual titration down REDUCE by 2 units every 1-2 week if fasting CBG in AM is < 150 on average - Continued emphasized making improvement to lifestyle with diabetic diet, low carb, improve regular exercise - now improved after shoulder  treatment - Again asked him to schedule w/ [facility] for DM Eye Follow-up 3 months for A1c repeat      Relevant Orders   Hemoglobin A1c      No orders of the defined types were placed in this encounter.   Follow up plan: Return in about 3 months (around 10/26/2018) for DM A1c.  Nobie Putnam, [facility] 07/26/2018, 8:53 AM

## 2018-07-26 NOTE — Assessment & Plan Note (Addendum)
Concern for inaccurate A1c today, still POC reading 10.5 elevated from last 9.1, despite dramatically improved clinical history and CBG report and medication increase from last visit. - A1c trend 12 > 9 > 10.5 - pending repeat serum lab today No known complications or hypoglycemia. concern for some mild neuropathy. - Failed Victoza 1.8 (GI intolerance diarrhea)  Plan:  1. No medication change today - re-check serum A1c to confirm accuracy, suspect falsely elevated POC reading - CONTINUE Trulicity 1.5mg  weekly [place] injection - he can afford, at this time, not in donut hole - CONTINUE Toujeo 48u nightly insulin injection, discussed gradual titration down REDUCE by 2 units every 1-2 week if fasting CBG in AM is < 150 on average - Continued emphasized making improvement to lifestyle with diabetic diet, low carb, improve regular exercise - now improved after shoulder treatment - Again asked him to schedule w/ [facility] for DM Eye Follow-up 3 months for A1c repeat

## 2018-07-27 LAB — HEMOGLOBIN A1C
Hgb A1c MFr Bld: 10.4 % of total Hgb — ABNORMAL HIGH (ref <5.7)
Mean Plasma Glucose: 252 (calc)
eAG (mmol/L): 13.9 (calc)

## 2018-08-04 ENCOUNTER — Other Ambulatory Visit: Payer: Self-pay | Admitting: Family Medicine

## 2018-08-04 DIAGNOSIS — E1169 Type 2 diabetes mellitus with other specified complication: Secondary | ICD-10-CM

## 2018-08-04 DIAGNOSIS — N181 Chronic kidney disease, stage 1: Secondary | ICD-10-CM

## 2018-08-04 DIAGNOSIS — Z794 Long term (current) use of insulin: Principal | ICD-10-CM

## 2018-08-04 DIAGNOSIS — E0822 Diabetes mellitus due to underlying condition with diabetic chronic kidney disease: Secondary | ICD-10-CM

## 2018-08-16 ENCOUNTER — Encounter: Payer: Self-pay | Admitting: Urology

## 2018-08-16 ENCOUNTER — Ambulatory Visit (INDEPENDENT_AMBULATORY_CARE_PROVIDER_SITE_OTHER): Payer: Medicare Other | Admitting: Urology

## 2018-08-16 ENCOUNTER — Other Ambulatory Visit: Payer: Self-pay | Admitting: Urology

## 2018-08-16 VITALS — BP 134/72 | HR 71 | Ht 75.0 in | Wt 238.4 lb

## 2018-08-16 DIAGNOSIS — R972 Elevated prostate specific antigen [PSA]: Secondary | ICD-10-CM

## 2018-08-16 MED ORDER — GENTAMICIN SULFATE 40 MG/ML IJ SOLN
80.0000 mg | Freq: Once | INTRAMUSCULAR | Status: AC
  Administered 2018-08-16: 80 mg via INTRAMUSCULAR

## 2018-08-16 MED ORDER — LEVOFLOXACIN 500 MG PO TABS
500.0000 mg | ORAL_TABLET | Freq: Once | ORAL | Status: AC
  Administered 2018-08-16: 500 mg via ORAL

## 2018-08-16 NOTE — Progress Notes (Signed)
Prostate Biopsy Procedure   Indications: 77 year old male with a PSA of 9.0 which remained elevated at 7.6 on repeat.  Informed consent was obtained after discussing risks/benefits of the procedure.  A time out was performed to ensure correct patient identity.  Pre-Procedure: 9.0- 03/2018; 7.6-05/2018 - Last PSA Level: - Gentamicin given prophylactically - Levaquin 500 mg administered PO -Transrectal Ultrasound performed revealing a 36.5 gm prostate -No significant hypoechoic or median lobe noted  Procedure: - Prostate block performed using 10 cc 1% lidocaine and biopsies taken from sextant areas, a total of 12 under ultrasound guidance.  Post-Procedure: - Patient tolerated the procedure well - He was counseled to seek immediate medical attention if experiences any severe pain, significant bleeding, or fevers - Return in one week to discuss biopsy results   John Giovanni, MD

## 2018-08-23 ENCOUNTER — Other Ambulatory Visit: Payer: Self-pay | Admitting: Urology

## 2018-08-23 LAB — PATHOLOGY REPORT

## 2018-09-01 ENCOUNTER — Encounter: Payer: Self-pay | Admitting: Urology

## 2018-09-01 ENCOUNTER — Ambulatory Visit: Payer: Medicare Other | Admitting: Urology

## 2018-09-04 ENCOUNTER — Telehealth: Payer: Self-pay | Admitting: Urology

## 2018-09-04 NOTE — Telephone Encounter (Signed)
-----   Message from Abbie Sons, MD sent at 09/01/2018 11:06 AM EDT ----- This patient had been notified of his negative biopsy results.  Can cancel his 1130 appointment and he needs a six-month follow-up for PSA/DRE.

## 2018-09-04 NOTE — Telephone Encounter (Signed)
apps made and mailed to patient  MB

## 2018-10-13 ENCOUNTER — Telehealth: Payer: Self-pay | Admitting: Family Medicine
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
## 2018-10-29 DEATH — deceased

## 2018-10-30 ENCOUNTER — Ambulatory Visit: Payer: Medicare Other | Admitting: Family Medicine

## 2019-03-02 ENCOUNTER — Other Ambulatory Visit: Payer: Medicare Other

## 2019-03-06 ENCOUNTER — Ambulatory Visit: Payer: Medicare Other | Admitting: Urology

## 2019-04-24 ENCOUNTER — Ambulatory Visit: Payer: Medicare Other
